# Patient Record
Sex: Male | Born: 1980 | Race: White | Hispanic: No | Marital: Married | Smoking: Former smoker
Health system: Southern US, Community
[De-identification: ages and names within clinical notes are randomized; demographics above are authoritative.]

## PROBLEM LIST (undated history)

## (undated) DIAGNOSIS — IMO0001 Reserved for inherently not codable concepts without codable children: Secondary | ICD-10-CM

## (undated) DIAGNOSIS — J329 Chronic sinusitis, unspecified: Secondary | ICD-10-CM

## (undated) DIAGNOSIS — I10 Essential (primary) hypertension: Secondary | ICD-10-CM

## (undated) DIAGNOSIS — K219 Gastro-esophageal reflux disease without esophagitis: Secondary | ICD-10-CM

## (undated) DIAGNOSIS — N289 Disorder of kidney and ureter, unspecified: Secondary | ICD-10-CM

---

## 2011-05-09 ENCOUNTER — Emergency Department (INDEPENDENT_AMBULATORY_CARE_PROVIDER_SITE_OTHER)
Admission: EM | Admit: 2011-05-09 | Discharge: 2011-05-09 | Disposition: A | Discharge status: Home / Self Care | Payer: Managed Care, Other (non HMO) | Source: Home / Self Care

## 2011-05-09 ENCOUNTER — Encounter (HOSPITAL_COMMUNITY): Payer: Self-pay | Admitting: *Deleted

## 2011-05-09 DIAGNOSIS — J069 Acute upper respiratory infection, unspecified: Secondary | ICD-10-CM

## 2011-05-09 MED ORDER — ACETAMINOPHEN 325 MG PO TABS
ORAL_TABLET | ORAL | Status: AC
  Filled 2011-05-09: qty 3

## 2011-05-09 MED ORDER — GUAIFENESIN-CODEINE 100-10 MG/5ML PO SYRP: 5.0000 mL | ORAL_SOLUTION | Freq: Three times a day (TID) | ORAL | Status: AC | PRN

## 2011-05-09 MED ORDER — IBUPROFEN 800 MG PO TABS
800.0000 mg | ORAL_TABLET | Freq: Once | ORAL | Status: AC
  Administered 2011-05-09: 800 mg via ORAL

## 2011-05-09 MED ORDER — IBUPROFEN 800 MG PO TABS
ORAL_TABLET | ORAL | Status: AC
  Filled 2011-05-09: qty 1

## 2011-05-09 MED ORDER — AZITHROMYCIN 250 MG PO TABS: ORAL_TABLET | ORAL | Status: AC

## 2011-05-09 NOTE — ED Provider Notes (Signed)
Medical screening examination/treatment/procedure(s) were performed by non-physician practitioner and as supervising physician I was immediately available for consultation/collaboration.  Thomas [name]   Robbert Langlinais, MD 05/09/11 1903 

## 2011-05-09 NOTE — Discharge Instructions (Signed)
Thank you for coming in today. I think you have a virus, may be the flu. Use the cough medicine especially at night. Hold on to the antibiotic.  Start it on Wednesday if you don't feel any better. If he takes the antibiotic now will just give you diarrhea. You should get better in a few days. Call or go to the emergency room if you get worse, have trouble breathing, have chest pains, or palpitations.   Antibiotic Nonuse  Your caregiver felt that the infection or problem was not one that would be helped with an antibiotic. Infections may be caused by viruses or bacteria. Only a caregiver can tell which one of these is the likely cause of an illness. A cold is the most common cause of infection in both adults and children. A cold is a virus. Antibiotic treatment will have no effect on a viral infection. Viruses can lead to many lost days of work caring for sick children and many missed days of school. Children may catch as many as 10 "colds" or "flus" per year during which they can be tearful, cranky, and uncomfortable. The goal of treating a virus is aimed at keeping the ill person comfortable. Antibiotics are medications used to help the body fight bacterial infections. There are relatively few types of bacteria that cause infections but there are hundreds of viruses. While both viruses and bacteria cause infection they are very different types of germs. A viral infection will typically go away by itself within 7 to 10 days. Bacterial infections may spread or get worse without antibiotic treatment. Examples of bacterial infections are:  Sore throats (like strep throat or tonsillitis).   Infection in the lung (pneumonia).   Ear and skin infections.  Examples of viral infections are:  Colds or flus.   Most coughs and bronchitis.   Sore throats not caused by Strep.   Runny noses.  It is often best not to take an antibiotic when a viral infection is the cause of the problem. Antibiotics can  kill off the helpful bacteria that we have inside our body and allow harmful bacteria to start growing. Antibiotics can cause side effects such as allergies, nausea, and diarrhea without helping to improve the symptoms of the viral infection. Additionally, repeated uses of antibiotics can cause bacteria inside of our body to become resistant. That resistance can be passed onto harmful bacterial. The next time you have an infection it may be harder to treat if antibiotics are used when they are not needed. Not treating with antibiotics allows our own immune system to develop and take care of infections more efficiently. Also, antibiotics will work better for [place] when they are prescribed for bacterial infections. Treatments for a child that is ill may include:  Give extra fluids throughout the day to stay hydrated.   Get plenty of rest.   Only give your child over-the-counter or prescription medicines for pain, discomfort, or fever as directed by your caregiver.   The use of a cool mist humidifier may help stuffy noses.   Cold medications if suggested by your caregiver.  Your caregiver may decide to start you on an antibiotic if:  The problem you were seen for today continues for a longer length of time than expected.   You develop a secondary bacterial infection.  SEEK MEDICAL CARE IF:  Fever lasts longer than 5 days.   Symptoms continue to get worse after 5 to 7 days or become severe.   Difficulty in  breathing develops.   Signs of dehydration develop (poor drinking, rare urinating, dark colored urine).   Changes in behavior or worsening tiredness (listlessness or lethargy).  Document Released: 04/05/2001 Document Revised: 01/14/2011 Document Reviewed: 10/02/2008 [facility] Patient Information 2012 [place], [place].

## 2011-05-09 NOTE — ED Provider Notes (Signed)
Thomas [name] is a 31 y.o. male who presents to Urgent Care today for cough, fever, chills, congestion since Tuesday. Additionally has a headache and myalgias.  No trouble breathing. Multiple sick contacts at work. Did not receive a flu shot this year. Has tried Tylenol and Sudafed which does help some.  PMH reviewed. Otherwise healthy young man ROS as above otherwise neg.  no chest pains, palpitations,  abdominal pain nausea or vomiting. Medications reviewed. Current Facility-Administered Medications  Medication Dose Route Frequency Provider Last Rate Last Dose  . ibuprofen (ADVIL,MOTRIN) tablet 800 mg  800 mg Oral Once Rodolph Bong, MD   800 mg at 05/09/11 1720   Current Outpatient Prescriptions  Medication Sig Dispense Refill  . Phenyleph-CPM-DM-APAP (TYLENOL COLD HEAD CONGESTION PO) Take by mouth.      . Pseudoeph-Doxylamine-DM-APAP (NYQUIL PO) Take by mouth.      Marland Kitchen azithromycin (ZITHROMAX) 250 MG tablet 2 pills po day 1, 1 pill po days 2-5  6 each  0  . guaiFENesin-codeine (ROBITUSSIN AC) 100-10 MG/5ML syrup Take 5 mLs by mouth 3 (three) times daily as needed for cough.  120 mL  0    Exam:  BP 131/80  Pulse 104  Temp(Src) 102 F (38.9 C) (Oral)  Resp 20  SpO2 100% Gen: Well NAD HEENT: EOMI,  MMM, normal tympanic membranes bilaterally. Normal posterior pharynx. Lungs: CTABL Nl WOB Heart: RRR no MRG Abd: NABS, NT, ND Exts: Non edematous BL  LE, warm and well perfused.    Assessment and Plan: 31 y.o. male with viral URI, likely. Pneumonia less likely as oxygen saturation and lung exam are normal. I additionally do not see any other signs of bacterial infection.  Plan to treat symptomatically with guaifenesin/codeine and Tylenol. However I will prescribe azithromycin for use if not better within 3 or 4 days. Discussed warning signs or problems return to health care. Please see patient instructions.     Rodolph Bong, MD 05/09/11 [phone]

## 2011-05-09 NOTE — ED Notes (Signed)
Pt with onset of cough/congestion/chills/fever/headache x 5 days - taking tyelnol and nyquil without relief

## 2011-10-09 ENCOUNTER — Encounter (HOSPITAL_COMMUNITY): Payer: Self-pay

## 2011-10-09 ENCOUNTER — Emergency Department (HOSPITAL_COMMUNITY)
Admission: EM | Admit: 2011-10-09 | Discharge: 2011-10-09 | Disposition: A | Discharge status: Home / Self Care | Payer: Managed Care, Other (non HMO) | Source: Home / Self Care | Attending: Emergency Medicine | Admitting: Emergency Medicine

## 2011-10-09 DIAGNOSIS — J329 Chronic sinusitis, unspecified: Secondary | ICD-10-CM

## 2011-10-09 DIAGNOSIS — H6690 Otitis media, unspecified, unspecified ear: Secondary | ICD-10-CM

## 2011-10-09 HISTORY — DX: Chronic sinusitis, unspecified: J32.9

## 2011-10-09 MED ORDER — PSEUDOEPHEDRINE-GUAIFENESIN ER 120-1200 MG PO TB12: 1.0000 | ORAL_TABLET | Freq: Two times a day (BID) | ORAL | Status: DC

## 2011-10-09 MED ORDER — CLARITHROMYCIN 500 MG PO TABS: 500.0000 mg | ORAL_TABLET | Freq: Two times a day (BID) | ORAL | Status: AC

## 2011-10-09 MED ORDER — FLUTICASONE PROPIONATE 50 MCG/ACT NA SUSP: 2.0000 | Freq: Every day | NASAL | Status: DC

## 2011-10-09 NOTE — ED Notes (Signed)
Sinus pressure, productive cough with green sputum, left ear feels stopped up . Symptoms started one week ago, progessively getting worse. Denies fever,chills or n/v

## 2011-10-09 NOTE — ED Provider Notes (Signed)
History     CSN: [id number]  Arrival date & time 10/09/11  1103   First MD Initiated Contact with Patient 10/09/11 1104      Chief Complaint  Patient presents with  . Facial Pain    sinus pressure, cough, ear pain    (Consider location/radiation/quality/duration/timing/severity/associated sxs/prior treatment) HPI Comments: Patient reports nasal congestion, frontal sinus pressure/pain, cough productive of greenish sputum in the morning starting a week ago. Reports postnasal drip, sore, irritated throat.. decreased hearing, left ear pain starting several days ago. Patient states this feels identical to previous episodes of sinusitis.  ROS as noted in HPI. All other ROS negative.   Patient is a 31 y.o. male presenting with cough. The history is provided by the patient. No language interpreter was used.  Cough This is a new problem. The current episode started more than 1 week ago. The problem occurs constantly. The problem has not changed since onset.The cough is productive of sputum. There has been no fever. Associated symptoms include ear congestion, ear pain and rhinorrhea. Pertinent negatives include no chills, no sore throat, no myalgias, no shortness of breath and no wheezing. He has tried decongestants for the symptoms. The treatment provided no relief. He is not a smoker.    Past Medical History  Diagnosis Date  . Sinusitis     History reviewed. No pertinent past surgical history.  History reviewed. No pertinent family history.  History  Substance Use Topics  . Smoking status: Former Games developer  . Smokeless tobacco: Not on file  . Alcohol Use: No      Review of Systems  Constitutional: Negative for chills.  HENT: Positive for ear pain and rhinorrhea. Negative for sore throat.   Respiratory: Positive for cough. Negative for shortness of breath and wheezing.   Musculoskeletal: Negative for myalgias.    Allergies  Penicillins  Home Medications   Current  Outpatient Rx  Name Route Sig Dispense Refill  . ADULT MULTIVITAMIN W/MINERALS CH Oral Take 1 tablet by mouth daily.    Marland Kitchen CLARITHROMYCIN 500 MG PO TABS Oral Take 1 tablet (500 mg total) by mouth 2 (two) times daily. X 10 days 20 tablet 0  . FLUTICASONE PROPIONATE 50 MCG/ACT NA SUSP Nasal Place 2 sprays into the nose daily. 16 g 0  . PSEUDOEPHEDRINE-GUAIFENESIN ER [phone] MG PO TB12 Oral Take 1 tablet by mouth 2 (two) times daily. 20 each 0    BP 145/86  Pulse 91  Temp 99.2 F (37.3 C) (Oral)  Resp 18  SpO2 96%  Physical Exam  Nursing note and vitals reviewed. Constitutional: He is oriented to person, place, and time. He appears well-developed and well-nourished.  HENT:  Head: Normocephalic and atraumatic.  Right Ear: Tympanic membrane normal.  Left Ear: There is tenderness. Tympanic membrane is bulging. A middle ear effusion is present.  Nose: Mucosal edema present. Right sinus exhibits maxillary sinus tenderness. Right sinus exhibits no frontal sinus tenderness. Left sinus exhibits maxillary sinus tenderness. Left sinus exhibits no frontal sinus tenderness.  Mouth/Throat: Uvula is midline, oropharynx is clear and moist and mucous membranes are normal.       Purulent nasal d/c  Eyes: Conjunctivae and EOM are normal.  Neck: Normal range of motion.  Cardiovascular: Normal rate, regular rhythm and normal heart sounds.   Pulmonary/Chest: Effort normal. No respiratory distress.  Abdominal: He exhibits no distension.  Musculoskeletal: Normal range of motion.  Lymphadenopathy:    He has no cervical adenopathy.  Neurological: He is alert  and oriented to person, place, and time. Coordination normal.  Skin: Skin is warm and dry.  Psychiatric: He has a normal mood and affect. His behavior is normal. Judgment and thought content normal.    ED Course  Procedures (including critical care time)  Labs Reviewed - No data to display No results found.   1. Sinusitis   2. Otitis media       MDM   Pt with indications for abx as has had sx >10 days. Will start clarithromycin to cover OM and sinus infxn in addition to flonase, mucinex-d, saline nasal irrigation, increase fluids, tylenol/motrin prn pain. Discussed MDM and plan with pt. Pt agrees with plan and will f/u with PMD prn.     Luiz Blare, MD 10/11/11 [phone]

## 2015-02-27 ENCOUNTER — Emergency Department (INDEPENDENT_AMBULATORY_CARE_PROVIDER_SITE_OTHER)
Admission: EM | Admit: 2015-02-27 | Discharge: 2015-02-27 | Disposition: A | Discharge status: Home / Self Care | Payer: Managed Care, Other (non HMO) | Source: Home / Self Care | Attending: Family Medicine | Admitting: Family Medicine

## 2015-02-27 ENCOUNTER — Encounter (HOSPITAL_COMMUNITY): Payer: Self-pay | Admitting: Emergency Medicine

## 2015-02-27 DIAGNOSIS — R05 Cough: Secondary | ICD-10-CM | POA: Diagnosis not present

## 2015-02-27 DIAGNOSIS — R059 Cough, unspecified: Secondary | ICD-10-CM

## 2015-02-27 DIAGNOSIS — J3489 Other specified disorders of nose and nasal sinuses: Secondary | ICD-10-CM

## 2015-02-27 DIAGNOSIS — J069 Acute upper respiratory infection, unspecified: Secondary | ICD-10-CM

## 2015-02-27 HISTORY — DX: Reserved for inherently not codable concepts without codable children: IMO0001

## 2015-02-27 HISTORY — DX: Gastro-esophageal reflux disease without esophagitis: K21.9

## 2015-02-27 NOTE — ED Provider Notes (Signed)
CSN: [id number]     Arrival date & time 02/27/15  1908 History   First MD Initiated Contact with Patient 02/27/15 2100     Chief Complaint  Patient presents with  . Cough   (Consider location/radiation/quality/duration/timing/severity/associated sxs/prior Treatment) HPI Comments: 35 year old male with a cough for 3 weeks. He states it is worse at night. He also has PND and has to clear his throat frequently. Denies fever. He is complaining of his ears feeling stopped up but no pain. no history of asthma and does not currently smoke.    Past Medical History  Diagnosis Date  . Sinusitis   . Reflux    History reviewed. No pertinent past surgical history. History reviewed. No pertinent family history. Social History  Substance Use Topics  . Smoking status: Former Games developer  . Smokeless tobacco: None  . Alcohol Use: No    Review of Systems  Constitutional: Negative for fever, diaphoresis, activity change and fatigue.  HENT: Positive for congestion, postnasal drip, sore throat and trouble swallowing. Negative for ear pain, facial swelling and rhinorrhea.   Eyes: Negative for pain, discharge and redness.  Respiratory: Positive for cough. Negative for chest tightness and shortness of breath.   Cardiovascular: Negative.   Gastrointestinal: Negative.   Musculoskeletal: Negative.  Negative for neck pain and neck stiffness.  Neurological: Negative.     Allergies  Penicillins  Home Medications   Prior to Admission medications   Medication Sig Start Date End Date Taking? Authorizing Provider  Pseudoeph-Doxylamine-DM-APAP (NYQUIL PO) Take by mouth.   Yes Historical Provider, MD  fluticasone (FLONASE) 50 MCG/ACT nasal spray Place 2 sprays into the nose daily. 10/09/11 10/08/12  Domenick Gong, MD  Multiple Vitamin (MULTIVITAMIN WITH MINERALS) TABS Take 1 tablet by mouth daily.    Historical Provider, MD  Pseudoephedrine-Guaifenesin (MUCINEX D) [phone] MG TB12 Take 1 tablet by mouth 2  (two) times daily. 10/09/11   Domenick Gong, MD   Meds Ordered and Administered this Visit  Medications - No data to display  BP 131/89 mmHg  Pulse 90  Temp(Src) 97.5 F (36.4 C) (Oral)  Resp 18  SpO2 96% No data found.   Physical Exam  Constitutional: He is oriented to person, place, and time. He appears well-developed and well-nourished. No distress.  HENT:  Bilateral TMs are retracted with minor injection. No apparent effusion. EACs are clear. Oropharynx with erythema, cobblestoning and moderate amount of clear PND. It is noticed that the patient is presently clearing his throat.  Eyes: EOM are normal.  Neck: Normal range of motion. Neck supple.  Cardiovascular: Normal rate, regular rhythm and normal heart sounds.   Pulmonary/Chest: Effort normal and breath sounds normal. No respiratory distress. He has no wheezes. He has no rales.  Musculoskeletal: Normal range of motion. He exhibits no edema.  Lymphadenopathy:    He has no cervical adenopathy.  Neurological: He is alert and oriented to person, place, and time.  Skin: Skin is warm and dry. No rash noted.  Psychiatric: He has a normal mood and affect.  Nursing note and vitals reviewed.   ED Course  Procedures (including critical care time)  Labs Review Labs Reviewed - No data to display  Imaging Review No results found.   Visual Acuity Review  Right Eye Distance:   Left Eye Distance:   Bilateral Distance:    Right Eye Near:   Left Eye Near:    Bilateral Near:         MDM   1. URI (  upper respiratory infection)   2. Sinus drainage   3. Cough    Cough, Adult Cough is primarily due to copious amounts of drainage in the back of your throat. This drainage coming from your sinuses. The best treatment for this is to take antihistamines. Nondrowsy formulas include Claritin, Allegra or Zyrtec. While at home he may also add Chlor-Trimeton 2-4 mg every 4 hours. This is a little stronger but can cause  drowsiness. For head congestion and stuffiness he may take Sudafed PE 10 mg every 4 hours. Also use saline nasal spray frequently. Drink plenty of fluids and stay well-hydrated. May take Robitussin-DM or Delsym to also help with cough    Hayden Rasmussen, NP 02/27/15 2114

## 2015-02-27 NOTE — ED Notes (Signed)
Started coughing approx 3 weeks ago.  Reports continued coughing, green phlegm.  Denies fever.  No nausea or vomiting, or diarrhea.  Patient has tried otc cold medicines, but no relief.

## 2015-02-27 NOTE — Discharge Instructions (Signed)
Cough, Adult Cough is primarily due to copious amounts of drainage in the back of your throat. This drainage coming from her sinuses. The best treatment for this is to take antihistamines. Nondrowsy formulas include Claritin, Allegra or Zyrtec. While at home he may also add Chlor-Trimeton 2-4 mg every 4 hours. This is a little stronger but can cause drowsiness. For head congestion and stuffiness he may take Sudafed PE 10 mg every 4 hours. Also use saline nasal spray frequently. Drink plenty of fluids and stay well-hydrated. May take Robitussin-DM or Delsym to also help with cough Coughing is a reflex that clears your throat and your airways. Coughing helps to heal and protect your lungs. It is normal to cough occasionally, but a cough that happens with other symptoms or lasts a long time may be a sign of a condition that needs treatment. A cough may last only 2-3 weeks (acute), or it may last longer than 8 weeks (chronic). CAUSES Coughing is commonly caused by:  Breathing in substances that irritate your lungs.  A viral or bacterial respiratory infection.  Allergies.  Asthma.  Postnasal drip.  Smoking.  Acid backing up from the stomach into the esophagus (gastroesophageal reflux).  Certain medicines.  Chronic lung problems, including COPD (or rarely, lung cancer).  Other medical conditions such as heart failure. HOME CARE INSTRUCTIONS  Pay attention to any changes in your symptoms. Take these actions to help with your discomfort:  Take medicines only as told by your health care provider.  If you were prescribed an antibiotic medicine, take it as told by your health care provider. Do not stop taking the antibiotic even if you start to feel better.  Talk with your health care provider before you take a cough suppressant medicine.  Drink enough fluid to keep your urine clear or pale yellow.  If the air is dry, use a cold steam vaporizer or humidifier in your bedroom or your home  to help loosen secretions.  Avoid anything that causes you to cough at work or at home.  If your cough is worse at night, try sleeping in a semi-upright position.  Avoid cigarette smoke. If you smoke, quit smoking. If you need help quitting, ask your health care provider.  Avoid caffeine.  Avoid alcohol.  Rest as needed. SEEK MEDICAL CARE IF:   You have new symptoms.  You cough up pus.  Your cough does not get better after 2-3 weeks, or your cough gets worse.  You cannot control your cough with suppressant medicines and you are losing sleep.  You develop pain that is getting worse or pain that is not controlled with pain medicines.  You have a fever.  You have unexplained weight loss.  You have night sweats. SEEK IMMEDIATE MEDICAL CARE IF:  You cough up blood.  You have difficulty breathing.  Your heartbeat is very fast.   This information is not intended to replace advice given to you by your health care provider. Make sure you discuss any questions you have with your health care provider.   Document Released: 07/24/2010 Document Revised: 10/16/2014 Document Reviewed: 04/03/2014 Elsevier Interactive Patient Education [employer]

## 2015-03-22 ENCOUNTER — Encounter (HOSPITAL_COMMUNITY): Payer: Self-pay | Admitting: Emergency Medicine

## 2015-03-22 ENCOUNTER — Emergency Department (INDEPENDENT_AMBULATORY_CARE_PROVIDER_SITE_OTHER)
Admission: EM | Admit: 2015-03-22 | Discharge: 2015-03-22 | Disposition: A | Discharge status: Home / Self Care | Payer: Managed Care, Other (non HMO) | Source: Home / Self Care | Attending: Family Medicine | Admitting: Family Medicine

## 2015-03-22 DIAGNOSIS — J0100 Acute maxillary sinusitis, unspecified: Secondary | ICD-10-CM

## 2015-03-22 MED ORDER — DOXYCYCLINE HYCLATE 100 MG PO CAPS: 100.0000 mg | ORAL_CAPSULE | Freq: Two times a day (BID) | ORAL | Status: DC

## 2015-03-22 MED ORDER — IPRATROPIUM BROMIDE 0.06 % NA SOLN: 2.0000 | Freq: Four times a day (QID) | NASAL | Status: DC

## 2015-03-22 NOTE — ED Provider Notes (Signed)
CSN: [id number]     Arrival date & time 03/22/15  1413 History   First MD Initiated Contact with Patient 03/22/15 1507     Chief Complaint  Patient presents with  . Sinus Problem   (Consider location/radiation/quality/duration/timing/severity/associated sxs/prior Treatment) Patient is a 35 y.o. male presenting with sinus complaint. The history is provided by the patient.  Sinus Problem This is a recurrent problem. The current episode started more than 2 days ago. The problem has been gradually worsening. Associated symptoms include headaches.    Past Medical History  Diagnosis Date  . Sinusitis   . Reflux    History reviewed. No pertinent past surgical history. No family history on file. Social History  Substance Use Topics  . Smoking status: Former Games developer  . Smokeless tobacco: None  . Alcohol Use: No    Review of Systems  Constitutional: Negative.   HENT: Positive for congestion, postnasal drip and sinus pressure.   Respiratory: Negative.   Cardiovascular: Negative.   Neurological: Positive for headaches.  All other systems reviewed and are negative.   Allergies  Penicillins  Home Medications   Prior to Admission medications   Medication Sig Start Date End Date Taking? Authorizing Provider  fluticasone (FLONASE) 50 MCG/ACT nasal spray Place 2 sprays into the nose daily. 10/09/11 10/08/12  Domenick Gong, MD  Multiple Vitamin (MULTIVITAMIN WITH MINERALS) TABS Take 1 tablet by mouth daily.    Historical Provider, MD  Pseudoeph-Doxylamine-DM-APAP (NYQUIL PO) Take by mouth.    Historical Provider, MD  Pseudoephedrine-Guaifenesin (MUCINEX D) [phone] MG TB12 Take 1 tablet by mouth 2 (two) times daily. 10/09/11   Domenick Gong, MD   Meds Ordered and Administered this Visit  Medications - No data to display  BP 128/91 mmHg  Pulse 92  Temp(Src) 98 F (36.7 C) (Oral)  SpO2 96% No data found.   Physical Exam  Constitutional: He is oriented to person, place, and  time. He appears well-developed and well-nourished. No distress.  HENT:  Head: Normocephalic.  Right Ear: External ear normal.  Left Ear: Tympanic membrane is erythematous. Tympanic membrane mobility is abnormal.  Mouth/Throat: Oropharynx is clear and moist.  Neck: Normal range of motion. Neck supple.  Cardiovascular: Normal heart sounds and intact distal pulses.   Pulmonary/Chest: Effort normal and breath sounds normal.  Lymphadenopathy:    He has no cervical adenopathy.  Neurological: He is alert and oriented to person, place, and time.  Skin: Skin is warm and dry.  Nursing note and vitals reviewed.   ED Course  Procedures (including critical care time)  Labs Review Labs Reviewed - No data to display  Imaging Review No results found.   Visual Acuity Review  Right Eye Distance:   Left Eye Distance:   Bilateral Distance:    Right Eye Near:   Left Eye Near:    Bilateral Near:         MDM  No diagnosis found.  Meds ordered this encounter  Medications  . doxycycline (VIBRAMYCIN) 100 MG capsule    Sig: Take 1 capsule (100 mg total) by mouth 2 (two) times daily.    Dispense:  20 capsule    Refill:  0  . ipratropium (ATROVENT) 0.06 % nasal spray    Sig: Place 2 sprays into both nostrils 4 (four) times daily.    Dispense:  15 mL    Refill:  1      Linna Hoff, MD 03/22/15 1539

## 2015-03-22 NOTE — ED Notes (Signed)
Here with sinus pressure between eyes with nasal congestion that started 3 days ago Sudafed and other otc meds not working Denies blurred vision, dizziness,n,v

## 2015-03-22 NOTE — Discharge Instructions (Signed)
Drink plenty of fluids as discussed, use medicine as prescribed, and mucinex or delsym for cough. Return or see your doctor if further problems °

## 2015-06-02 ENCOUNTER — Ambulatory Visit: Payer: Managed Care, Other (non HMO) | Admitting: Physician Assistant

## 2015-06-18 ENCOUNTER — Ambulatory Visit: Payer: Managed Care, Other (non HMO) | Admitting: Physician Assistant

## 2015-06-21 ENCOUNTER — Encounter: Payer: Self-pay | Admitting: Physician Assistant

## 2016-04-14 ENCOUNTER — Emergency Department (HOSPITAL_COMMUNITY)
Admission: EM | Admit: 2016-04-14 | Discharge: 2016-04-14 | Disposition: A | Discharge status: Home / Self Care | Payer: Managed Care, Other (non HMO) | Attending: Emergency Medicine | Admitting: Emergency Medicine

## 2016-04-14 ENCOUNTER — Encounter (HOSPITAL_COMMUNITY): Payer: Self-pay | Admitting: Emergency Medicine

## 2016-04-14 ENCOUNTER — Emergency Department (HOSPITAL_COMMUNITY): Payer: Managed Care, Other (non HMO)

## 2016-04-14 DIAGNOSIS — Z87891 Personal history of nicotine dependence: Secondary | ICD-10-CM | POA: Insufficient documentation

## 2016-04-14 DIAGNOSIS — H538 Other visual disturbances: Secondary | ICD-10-CM | POA: Diagnosis not present

## 2016-04-14 LAB — CBG MONITORING, ED: Glucose-Capillary: 93 mg/dL (ref 65–99)

## 2016-04-14 NOTE — ED Triage Notes (Signed)
Pt states over 1 week ago he started having blurry peripheral vision that comes and goes. Pt states 45 minutes ago he had a 10 minute episode of blurry vision that has now resolved. Pt has no other neuro deificts at this time.

## 2016-04-14 NOTE — ED Notes (Signed)
Patient transported to CT 

## 2016-04-14 NOTE — ED Notes (Signed)
EDP at bedside  

## 2016-04-14 NOTE — ED Provider Notes (Signed)
[facility]ER[facility]ENCY DEPT Provider Note   CSN: [id number] Arrival date & time: 04/14/16  1032     History   Chief Complaint Chief Complaint  Patient presents with  . Blurred Vision    HPI Thomas [name] is a 36 y.o. male.  36 yo M with blurred vision.  Going on for past couple of weeks. Started in the periphery now occurring more often and is his whole vision.  Gets anxious with episodes.  Seems to be better with one eye closure.  Feels like the world is spinning.  Denies headache. Nothing seems to trigger these events.    The history is provided by the patient, the spouse and a parent.  Illness  This is a new problem. The current episode started more than 2 days ago. The problem occurs constantly. The problem has not changed since onset.Pertinent negatives include no chest pain, no abdominal pain, no headaches and no shortness of breath. Nothing aggravates the symptoms. Nothing relieves the symptoms. He has tried nothing for the symptoms. The treatment provided no relief.    Past Medical History:  Diagnosis Date  . Reflux   . Sinusitis     There are no active problems to display for this patient.   History reviewed. No pertinent surgical history.     Home Medications    Prior to Admission medications   Medication Sig Start Date End Date Taking? Authorizing Provider  fluticasone (FLONASE) 50 MCG/ACT nasal spray Place 2 sprays into the nose daily. Patient not taking: Reported on 04/14/2016 10/09/11 10/08/12  Domenick Gong, MD  ipratropium (ATROVENT) 0.06 % nasal spray Place 2 sprays into both nostrils 4 (four) times daily. Patient not taking: Reported on 04/14/2016 03/22/15   Linna Hoff, MD  Pseudoephedrine-Guaifenesin [facility] D) [phone] MG TB12 Take 1 tablet by mouth 2 (two) times daily. Patient not taking: Reported on 04/14/2016 10/09/11   Domenick Gong, MD    Family History No family history on file.  Social History Social History  Substance Use Topics  .  Smoking status: Former Games developer  . Smokeless tobacco: Not on file  . Alcohol use No     Allergies   Penicillins   Review of Systems Review of Systems  Constitutional: Negative for chills and fever.  HENT: Negative for congestion and facial swelling.   Eyes: Positive for visual disturbance (blurred vision). Negative for discharge.  Respiratory: Negative for shortness of breath.   Cardiovascular: Negative for chest pain and palpitations.  Gastrointestinal: Negative for abdominal pain, diarrhea, nausea and vomiting.  Musculoskeletal: Negative for arthralgias and myalgias.  Skin: Negative for color change and rash.  Neurological: Positive for dizziness. Negative for tremors, syncope and headaches.  Psychiatric/Behavioral: Negative for confusion and dysphoric mood.     Physical Exam Updated Vital Signs BP (!) 149/101   Pulse 93   Temp 98.6 F (37 C) (Oral)   Resp 14   Ht 5\' 9"  (1.753 m)   Wt 204 lb (92.5 kg)   SpO2 95%   BMI 30.13 kg/m   Physical Exam  Constitutional: He is oriented to person, place, and time. He appears well-developed and well-nourished.  HENT:  Head: Normocephalic and atraumatic.  Eyes: EOM are normal. Pupils are equal, round, and reactive to light.  Neck: Normal range of motion. Neck supple. No JVD present.  Cardiovascular: Normal rate and regular rhythm.  Exam reveals no gallop and no friction rub.   No murmur heard. Pulmonary/Chest: No respiratory distress. He has no wheezes.  Abdominal:  He exhibits no distension and no mass. There is no tenderness. There is no rebound and no guarding.  Musculoskeletal: Normal range of motion.  Neurological: He is alert and oriented to person, place, and time. He has normal strength. No cranial nerve deficit or sensory deficit. Coordination and gait normal. GCS eye subscore is 4. GCS verbal subscore is 5. GCS motor subscore is 6. He displays no Babinski's sign on the right side. He displays no Babinski's sign on the  left side.  Reflex Scores:      Tricep reflexes are 2+ on the right side and 2+ on the left side.      Bicep reflexes are 2+ on the right side and 2+ on the left side.      Brachioradialis reflexes are 2+ on the right side and 2+ on the left side.      Patellar reflexes are 2+ on the right side and 2+ on the left side.      Achilles reflexes are 2+ on the right side and 2+ on the left side. Skin: No rash noted. No pallor.  Psychiatric: He has a normal mood and affect. His behavior is normal.  Nursing note and vitals reviewed.    ED Treatments / Results  Labs (all labs ordered are listed, but only abnormal results are displayed) Labs Reviewed  CBG MONITORING, ED    EKG  EKG Interpretation None       Radiology Ct Head Wo Contrast  Result Date: 04/14/2016 CLINICAL DATA:  Blurred vision, dizziness EXAM: CT HEAD WITHOUT CONTRAST TECHNIQUE: Contiguous axial images were obtained from the base of the skull through the vertex without intravenous contrast. COMPARISON:  None. FINDINGS: Brain: No evidence of acute infarction, hemorrhage, hydrocephalus, extra-axial collection or mass lesion/mass effect. Vascular: No hyperdense vessel or unexpected calcification. Skull: Normal. Negative for fracture or focal lesion. Sinuses/Orbits: The visualized paranasal sinuses are essentially clear. The mastoid air cells are unopacified. Other: None. IMPRESSION: Normal head CT. Electronically Signed   By: Charline BillsSriyesh  Krishnan M.D.   On: 04/14/2016 12:28   Mr Brain Wo Contrast  Result Date: 04/14/2016 CLINICAL DATA:  New onset episodes of blurred vision beginning 1 week ago. EXAM: MRI HEAD WITHOUT CONTRAST TECHNIQUE: Multiplanar, multiecho pulse sequences of the brain and surrounding structures were obtained without intravenous contrast. COMPARISON:  CT head without contrast from the same day. FINDINGS: Brain: No acute infarct, hemorrhage, or mass lesion is present. The ventricles are of normal size. No significant  extraaxial fluid collection is present. No significant white matter disease is present. Internal auditory canals are within normal limits. Vascular: Flow is present in the major intracranial arteries. Skull and upper cervical spine: The skullbase is normal. The craniocervical junction is normal. Midline sagittal structures are unremarkable. Sinuses/Orbits: Mild diffuse mucosal thickening present throughout the paranasal sinuses. There are no significant fluid levels. The mastoid air cells are clear. The globes and orbits are within normal limits bilaterally. IMPRESSION: Negative MRI of the brain. No acute or focal lesion to explain the patient's visual changes. Electronically Signed   By: Marin Robertshristopher  Mattern M.D.   On: 04/14/2016 13:42    Procedures Procedures (including critical care time)  Medications Ordered in ED Medications - No data to display   Initial Impression / Assessment and Plan / ED Course  I have reviewed the triage vital signs and the nursing notes.  Pertinent labs & imaging results that were available during my care of the patient were reviewed by me and considered  in my medical decision making (see chart for details).     36 yo M with blurry vision.  Going on for past couple of months. Discussed with neuro, due to length of symptoms recommended MR at this time.  MRI is negative for acute process. CT of the head was also negative. Patient was given follow-up with ophthalmology as well as neurology.  2:42 PM:  I have discussed the diagnosis/risks/treatment options with the patient and family and believe the pt to be eligible for discharge home to follow-up with PCP. We also discussed returning to the ED immediately if new or worsening sx occur. We discussed the sx which are most concerning (e.g., sudden worsening pain, fever, inability to tolerate by mouth) that necessitate immediate return. Medications administered to the patient during their visit and any new prescriptions  provided to the patient are listed below.  Medications given during this visit Medications - No data to display   The patient appears reasonably screen and/or stabilized for discharge and I doubt any other medical condition or other [facility] requiring further screening, evaluation, or treatment in the ED at this time prior to discharge.    Final Clinical Impressions(s) / ED Diagnoses   Final diagnoses:  Blurry vision    New Prescriptions New Prescriptions   No medications on file     Melene Plan, DO 04/14/16 1442

## 2016-04-14 NOTE — ED Notes (Signed)
PA at bedside.

## 2016-04-14 NOTE — Discharge Instructions (Signed)
Follow up with neurology and optho

## 2016-04-16 ENCOUNTER — Encounter: Payer: Self-pay | Admitting: Neurology

## 2016-05-06 ENCOUNTER — Encounter: Payer: Self-pay | Admitting: Neurology

## 2016-05-06 ENCOUNTER — Ambulatory Visit (INDEPENDENT_AMBULATORY_CARE_PROVIDER_SITE_OTHER): Payer: Managed Care, Other (non HMO) | Admitting: Neurology

## 2016-05-06 VITALS — BP 118/84 | HR 104 | Ht 69.0 in | Wt 206.1 lb

## 2016-05-06 DIAGNOSIS — G43109 Migraine with aura, not intractable, without status migrainosus: Secondary | ICD-10-CM | POA: Diagnosis not present

## 2016-05-06 NOTE — Progress Notes (Signed)
[facility] Neurology Division Clinic Note - Initial Visit   Date: 05/06/16  Khyree Carillo MRN: [id number] DOB: February 26, 1980   Dear Dr. Jeanice Lim:  Thank you for your kind referral of Fairley Copher for consultation of vision changes. Although his history is well known to you, please allow [place] to reiterate it for the purpose of our medical record. The patient was accompanied to the clinic by wife who also provides collateral information.     History of Present Illness: Morrie Daywalt is a 36 y.o. right-handed Caucasian male with GERD and tobacco user presenting for evaluation of vision changes.    On March 7th, he was driving in his mother's driveway and suddenly developed tunnel/blurry vision.  He did not loose vision, but states that the blurred vision was severe that he could not focus.  He pulled over and tried to close each eye and noticed that it continued be present in both eyes.  Symptoms lasted about 10 minutes and self-resolved. He went to the ER where CT head and MRI brain was negative.   Two weeks prior to this, he developed blurry spot around his peripheral vision which was transient.    It happened again last week at work when he was walking to the break lounge and developed the same severe blurry vision.  It occurs for 10-15 minutes and self resolves.  He has associated lightheadedness and feeling anxious when this happens.  He denies palpitations.   He does not have associated headaches, numbness/tingling, or eye pain.   He saw his eye doctor who noted mild refractive error, but did not recommend corrective lenses.  Otherwise, he had good eye health.   No family or personal history of migraines.   Out-side paper records, electronic medical record, and images have been reviewed where available and summarized as:  MRI brain wo contrast 04/14/2016:  Negative MRI of the brain. No acute or focal lesion to explain the patient's visual changes.  CT head 04/14/2016:   Normal  Past Medical History:  Diagnosis Date  . Reflux   . Sinusitis     No past surgical history on file.   Medications:  Outpatient Encounter Prescriptions as of 05/06/2016  Medication Sig  . [DISCONTINUED] fluticasone (FLONASE) 50 MCG/ACT nasal spray Place 2 sprays into the nose daily. (Patient not taking: Reported on 04/14/2016)  . [DISCONTINUED] ipratropium (ATROVENT) 0.06 % nasal spray Place 2 sprays into both nostrils 4 (four) times daily. (Patient not taking: Reported on 04/14/2016)  . [DISCONTINUED] Pseudoephedrine-Guaifenesin (MUCINEX D) [phone] MG TB12 Take 1 tablet by mouth 2 (two) times daily. (Patient not taking: Reported on 04/14/2016)   No facility-administered encounter medications on file as of 05/06/2016.      Allergies:  Allergies  Allergen Reactions  . Penicillins Rash    Pt reports not allergic    Family History: Family History  Problem Relation Age of Onset  . Breast cancer Mother   . Pneumonia Father   . COPD Sister   . Diabetes Mellitus I Maternal Grandmother   . Diabetes Mellitus I Paternal Grandmother   . Heart disease Paternal Grandfather     Social History: Social History  Substance Use Topics  . Smoking status: Current Every Day Smoker    Packs/day: 1.00    Years: 15.00  . Smokeless tobacco: Never Used  . Alcohol use Yes     Comment: occasionally   Social History   Social History Narrative   Lives with wife in a one story home.  Has 2 children.     Works for [employer].     Education: high school.     Review of Systems:  CONSTITUTIONAL: No fevers, chills, night sweats, or weight loss.   EYES: No visual changes or eye pain ENT: No hearing changes.  No history of nose bleeds.   RESPIRATORY: No cough, wheezing and shortness of breath.   CARDIOVASCULAR: Negative for chest pain, and palpitations.   GI: Negative for abdominal discomfort, blood in stools or black stools.  No recent change in bowel habits.   GU:  No history of  incontinence.   MUSCLOSKELETAL: No history of joint pain or swelling.  No myalgias.   SKIN: Negative for lesions, rash, and itching.   HEMATOLOGY/ONCOLOGY: Negative for prolonged bleeding, bruising easily, and swollen nodes.  No history of cancer.   ENDOCRINE: Negative for cold or heat intolerance, polydipsia or goiter.   PSYCH:  No depression + anxiety symptoms.   NEURO: As Above.   Vital Signs:  BP 118/84   Pulse (!) 104   Ht 5\' 9"  (1.753 m)   Wt 206 lb 1 oz (93.5 kg)   SpO2 95%   BMI 30.43 kg/m    General Medical Exam:   General:  Well appearing, comfortable.   Eyes/ENT: see cranial nerve examination.   Neck: No masses appreciated.  Full range of motion without tenderness.  No carotid bruits. Respiratory:  Clear to auscultation, good air entry bilaterally.   Cardiac:  Regular rate and rhythm, no murmur.   Extremities:  No deformities, edema, or skin discoloration.  Skin:  No rashes or lesions.  Neurological Exam: MENTAL STATUS including orientation to time, place, person, recent and remote memory, attention span and concentration, language, and fund of knowledge is normal.  Speech is not dysarthric.  CRANIAL NERVES: II:  No visual field defects.  Unremarkable fundi.   III-IV-VI: Pupils equal round and reactive to light.  Normal conjugate, extra-ocular eye movements in all directions of gaze.  No nystagmus.  No ptosis.   V:  Normal facial sensation.    VII:  Normal facial symmetry and movements.  No pathologic facial reflexes.  VIII:  Normal hearing and vestibular function.   IX-X:  Normal palatal movement.   XI:  Normal shoulder shrug and head rotation.   XII:  Normal tongue strength and range of motion, no deviation or fasciculation.  MOTOR:  No atrophy, fasciculations or abnormal movements.  No pronator drift.  Tone is normal.    Right Upper Extremity:    Left Upper Extremity:    Deltoid  5/5   Deltoid  5/5   Biceps  5/5   Biceps  5/5   Triceps  5/5   Triceps  5/5    Wrist extensors  5/5   Wrist extensors  5/5   Wrist flexors  5/5   Wrist flexors  5/5   Finger extensors  5/5   Finger extensors  5/5   Finger flexors  5/5   Finger flexors  5/5   Dorsal interossei  5/5   Dorsal interossei  5/5   Abductor pollicis  5/5   Abductor pollicis  5/5   Tone (Ashworth scale)  0  Tone (Ashworth scale)  0   Right Lower Extremity:    Left Lower Extremity:    Hip flexors  5/5   Hip flexors  5/5   Hip extensors  5/5   Hip extensors  5/5   Knee flexors  5/5   Knee  flexors  5/5   Knee extensors  5/5   Knee extensors  5/5   Dorsiflexors  5/5   Dorsiflexors  5/5   Plantarflexors  5/5   Plantarflexors  5/5   Toe extensors  5/5   Toe extensors  5/5   Toe flexors  5/5   Toe flexors  5/5   Tone (Ashworth scale)  0  Tone (Ashworth scale)  0   MSRs:  Right                                                                 Left brachioradialis 2+  brachioradialis 2+  biceps 2+  biceps 2+  triceps 2+  triceps 2+  patellar 2+  patellar 2+  ankle jerk 2+  ankle jerk 2+  Hoffman no  Hoffman no  plantar response down  plantar response down   SENSORY:  Normal and symmetric perception of light touch, pinprick, vibration, and proprioception.  Romberg's sign absent.   COORDINATION/GAIT: Normal finger-to- nose-finger and heel-to-shin.  Intact rapid alternating movements bilaterally.  Able to rise from a chair without using arms.  Gait narrow based and stable. Tandem and stressed gait intact.    IMPRESSION: Mr. Donnajean Lopesittle is a 36 year old gentleman referred for evaluation of transient vision changes, described as very blurry as if looking through a kaleidoscope. His exam is entirely normal and nonfocal. He has had CT and MRI brain which does not show any worrisome findings. His eye evaluation also has been unremarkable. 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 I recommend that he continue to monitor symptoms and pain attention to  any preceding symptoms or warnings. Encouraged him to be cautious when driving stopped driving should he developed these symptoms, which he is aware of. If symptoms become more frequent, we may need to start him on a preventative medication.  Return to clinic if symptoms worsen.   The duration of this appointment visit was 40 minutes of face-to-face time with the patient.  Greater than 50% of this time was spent in counseling, explanation of diagnosis, planning of further management, and coordination of care.   Thank you for allowing me to participate in patient's care.  If I can answer any additional questions, I would be pleased to do so.    Sincerely,    Donika K. Allena KatzPatel, DO

## 2016-05-06 NOTE — Patient Instructions (Addendum)
Keep a journal of your events   Return to clinic as needed

## 2016-07-02 ENCOUNTER — Encounter: Payer: Self-pay | Admitting: Family Medicine

## 2016-11-15 ENCOUNTER — Ambulatory Visit (HOSPITAL_COMMUNITY)
Admission: EM | Admit: 2016-11-15 | Discharge: 2016-11-15 | Disposition: A | Discharge status: Home / Self Care | Payer: Self-pay | Attending: Urgent Care | Admitting: Urgent Care

## 2016-11-15 ENCOUNTER — Encounter (HOSPITAL_COMMUNITY): Payer: Self-pay | Admitting: Emergency Medicine

## 2016-11-15 DIAGNOSIS — F17200 Nicotine dependence, unspecified, uncomplicated: Secondary | ICD-10-CM

## 2016-11-15 DIAGNOSIS — B9789 Other viral agents as the cause of diseases classified elsewhere: Secondary | ICD-10-CM

## 2016-11-15 DIAGNOSIS — J029 Acute pharyngitis, unspecified: Secondary | ICD-10-CM

## 2016-11-15 DIAGNOSIS — J069 Acute upper respiratory infection, unspecified: Secondary | ICD-10-CM

## 2016-11-15 MED ORDER — CETIRIZINE HCL 10 MG PO TABS: 10.0000 mg | ORAL_TABLET | Freq: Every day | ORAL | 0 refills | Status: DC

## 2016-11-15 MED ORDER — HYDROCODONE-HOMATROPINE 5-1.5 MG/5ML PO SYRP: 5.0000 mL | ORAL_SOLUTION | Freq: Every evening | ORAL | 0 refills | Status: DC | PRN

## 2016-11-15 MED ORDER — PSEUDOEPHEDRINE HCL ER 120 MG PO TB12: 120.0000 mg | ORAL_TABLET | Freq: Two times a day (BID) | ORAL | 3 refills | Status: DC

## 2016-11-15 MED ORDER — BENZONATATE 100 MG PO CAPS: 100.0000 mg | ORAL_CAPSULE | Freq: Three times a day (TID) | ORAL | 0 refills | Status: DC | PRN

## 2016-11-15 NOTE — ED Triage Notes (Addendum)
Cough, sneezing, body aches.  Onset of symptoms 2-3 days ago.  Has tried theraflu and mucinex.  Does not feel these medicines have helped Patient feels pressure in both ears.  Patient complains of head congestion

## 2016-11-15 NOTE — ED Provider Notes (Signed)
MRN: [id number] DOB: [birth year]  Subjective:   Thomas [name] is a 36 y.o. male presenting for chief complaint of URI  Reports 3 day history of productive cough, sinus congestion, bilateral ear fullness and pressure, body aches, malaise, sore throat (now improved). Has tried thera-flu and Mucinex with minimal relief. Smokes 1ppd. Denies fever, sinus pain, ear pain, chest pain, shob, wheezing, n/v, abdominal pain, rashes.   Thomas [name] is not currently taking any medications and is allergic to penicillins.  Thomas [name]  has a past medical history of Reflux and Sinusitis. Denies past surgical history.  Objective:   Vitals: BP 129/89 (BP Location: Left Arm)   Pulse 95   Temp 98.2 F (36.8 C) (Oral)   Resp 20   SpO2 97%   Physical Exam  Constitutional: He is oriented to person, place, and time. He appears well-developed and well-nourished.  HENT:  Mouth/Throat: Oropharynx is clear and moist.  Eyes: Right eye exhibits no discharge. Left eye exhibits no discharge.  Neck: Normal range of motion. Neck supple.  Cardiovascular: Normal rate, regular rhythm and intact distal pulses.  Exam reveals no gallop and no friction rub.   No murmur heard. Pulmonary/Chest: No respiratory distress. He has no wheezes. He has no rales.  Lymphadenopathy:    He has no cervical adenopathy.  Neurological: He is alert and oriented to person, place, and time.  Skin: Skin is warm and dry.   Assessment and Plan :   Viral URI with cough  Sore throat  Tobacco use disorder  Will manage supportively with Zyrtec, Sudafed. Use cough suppression medications. Return-to-clinic precautions discussed, patient verbalized understanding.    Wallis Bamberg, PA-C [facility]  11/15/2016  11:03 AM    Wallis Bamberg, PA-C 11/15/16 1139

## 2017-03-23 ENCOUNTER — Encounter (HOSPITAL_COMMUNITY): Payer: Self-pay

## 2017-03-23 ENCOUNTER — Emergency Department (HOSPITAL_COMMUNITY): Payer: Self-pay

## 2017-03-23 ENCOUNTER — Emergency Department (HOSPITAL_COMMUNITY)
Admission: EM | Admit: 2017-03-23 | Discharge: 2017-03-23 | Disposition: A | Discharge status: Home / Self Care | Payer: Self-pay | Attending: Emergency Medicine | Admitting: Emergency Medicine

## 2017-03-23 DIAGNOSIS — R05 Cough: Secondary | ICD-10-CM | POA: Insufficient documentation

## 2017-03-23 DIAGNOSIS — R059 Cough, unspecified: Secondary | ICD-10-CM

## 2017-03-23 DIAGNOSIS — F17210 Nicotine dependence, cigarettes, uncomplicated: Secondary | ICD-10-CM | POA: Insufficient documentation

## 2017-03-23 DIAGNOSIS — Z79899 Other long term (current) drug therapy: Secondary | ICD-10-CM | POA: Insufficient documentation

## 2017-03-23 MED ORDER — BENZONATATE 100 MG PO CAPS: 100.0000 mg | ORAL_CAPSULE | Freq: Three times a day (TID) | ORAL | 0 refills | Status: DC

## 2017-03-23 NOTE — Discharge Instructions (Signed)
Take Tessalon as needed for cough.  Drink plenty of fluids and get plenty of rest.  Use warm water salt gargles, honey, tea, and throat lozenges to soothe hoarse voice/sore throat.  You may also try over-the-counter acid reflux medicines which may help with your cough and hoarse voice.  Follow-up with a primary care physician for reevaluation of your symptoms.  Return to the emergency department if any concerning signs or symptoms develop.

## 2017-03-23 NOTE — ED Triage Notes (Signed)
Patient reports of cough x1 month. Denies fever. Has taken otc medications with no relief.

## 2017-03-23 NOTE — ED Provider Notes (Signed)
[facility] Provider Note   CSN: [id number] Arrival date & time: 03/23/17  1047     History   Chief Complaint Chief Complaint  Patient presents with  . Cough    HPI Doroteo BradfordBenjamin Beyene is a 37 y.o. male with history of reflux and sinusitis presents today for evaluation of acute onset cough for 1 month.  Cough is nonproductive.  He denies fevers or chills.  No chest pain or shortness of breath.  He denies nasal congestion or sore throat.  He states that he recently developed a hoarse voice.  No recent travel or surgeries, no prior history of DVT or PE.  No hemoptysis.  States his daughter recently had bronchitis but his symptoms have been ongoing for longer than that.  He has tried over-the-counter NyQuil and Delsym without significant relief of his symptoms.  He is a smoker of approximately a pack of cigarettes daily.  The history is provided by the patient.    Past Medical History:  Diagnosis Date  . Reflux   . Sinusitis     Patient Active Problem List   Diagnosis Date Noted  . 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 No pertinent surgical history.     Home Medications    Prior to Admission medications   Medication Sig Start Date End Date Taking? Authorizing Provider  benzonatate (TESSALON) 100 MG capsule Take 1 capsule (100 mg total) by mouth every 8 (eight) hours. 03/23/17   Dimitra Woodstock A, PA-C  cetirizine (ZYRTEC ALLERGY) 10 MG tablet Take 1 tablet (10 mg total) by mouth daily. 11/15/16   Wallis BambergMani, Mario, PA-C  Chlorphen-Pseudoephed-APAP [facility](THERAFLU FLU/COLD PO) Take by mouth.    [provider]  guaiFENesin (MUCINEX) 600 MG 12 hr tablet Take by mouth 2 (two) times daily.    [provider]  HYDROcodone-homatropine (HYCODAN) 5-1.5 MG/5ML syrup Take 5 mLs by mouth at bedtime as needed. 11/15/16   Wallis BambergMani, Mario, PA-C  pseudoephedrine (SUDAFED 12 HOUR) 120 MG 12 hr tablet Take 1 tablet (120 mg total) by mouth 2 (two) times daily. 11/15/16    Wallis BambergMani, Mario, PA-C    Family History Family History  Problem Relation Age of Onset  . Breast cancer Mother   . Pneumonia Father   . COPD Sister   . Diabetes Mellitus I Maternal Grandmother   . Diabetes Mellitus I Paternal Grandmother   . Heart disease Paternal Grandfather     Social History Social History   Tobacco Use  . Smoking status: Current Every Day Smoker    Packs/day: 1.00    Years: 15.00    Pack years: 15.00  . Smokeless tobacco: Never Used  Substance Use Topics  . Alcohol use: Yes    Comment: occasionally  . Drug use: No     Allergies   Penicillins   Review of Systems Review of Systems  Constitutional: Negative for chills and fever.  HENT: Positive for voice change. Negative for congestion and sore throat.   Respiratory: Positive for cough. Negative for shortness of breath.   Cardiovascular: Negative for chest pain.  All other systems reviewed and are negative.    Physical Exam Updated Vital Signs BP 128/87   Pulse 90   Temp 98.2 F (36.8 C) (Oral)   Resp 17   Ht 5\' 9"  (1.753 m)   Wt 91.6 kg (202 lb)   SpO2 95%   BMI 29.83 kg/m   Physical Exam  Constitutional: He appears well-developed and well-nourished. No distress.  HENT:  Head: Normocephalic and atraumatic.  Right Ear: External ear normal.  Left Ear: External ear normal.  Nose: Nose normal.  Mouth/Throat: Oropharynx is clear and moist.  TMs without erythema or bulging bilaterally.  Nasal septum midline without mucosal edema.  Posterior oropharynx with postnasal drip and mild erythema but no tonsillar hypertrophy, exudates, or uvular deviation.  No trismus or sublingual abnormalities.  Speaks with a mildly hoarse voice.  Eyes: Conjunctivae and EOM are normal. Pupils are equal, round, and reactive to light. Right eye exhibits no discharge. Left eye exhibits no discharge.  Neck: Normal range of motion. Neck supple. No JVD present. No tracheal deviation present.  Cardiovascular: Normal  rate, regular rhythm, normal heart sounds and intact distal pulses.  Pulmonary/Chest: Effort normal and breath sounds normal. No stridor. No respiratory distress. He has no wheezes. He has no rales. He exhibits no tenderness.  Abdominal: Soft. Bowel sounds are normal. He exhibits no distension. There is no tenderness.  Musculoskeletal: Normal range of motion. He exhibits no edema.  Lymphadenopathy:    He has no cervical adenopathy.  Neurological: He is alert.  Skin: Skin is warm and dry. No erythema.  Psychiatric: He has a normal mood and affect. His behavior is normal.  Nursing note and vitals reviewed.    ED Treatments / Results  Labs (all labs ordered are listed, but only abnormal results are displayed) Labs Reviewed - No data to display  EKG  EKG Interpretation None       Radiology Dg Chest 2 View  Result Date: 03/23/2017 CLINICAL DATA:  Dry cough for the past month. EXAM: CHEST  2 VIEW COMPARISON:  None. FINDINGS: The heart size and mediastinal contours are within normal limits. Both lungs are clear. The visualized skeletal structures are unremarkable. IMPRESSION: No active cardiopulmonary disease. Electronically Signed   By: Obie Dredge M.D.   On: 03/23/2017 11:49    Procedures Procedures (including critical care time)  Medications Ordered in ED Medications - No data to display   Initial Impression / Assessment and Plan / ED Course  I have reviewed the triage vital signs and the nursing notes.  Pertinent labs & imaging results that were available during my care of the patient were reviewed by me and considered in my medical decision making (see chart for details).     Patient with cough for 1 month, not worsening.  Afebrile, vital signs are stable (initially tachycardic but this resolved on reevaluation).  He is nontoxic in appearance.  No fever or meningeal signs to suggest meningitis.  Chest x-ray shows no acute abnormality such as pneumonia or pleural  effusion or bronchitis.  No medication changes to explain cough.  I doubt PE in the absence of shortness of breath or hypoxia.  Possible GERD involvement.  Will discharge with Tessalon and discussed symptomatic treatment.  Recommend follow-up with primary care physician for reevaluation of symptoms.  Discussed indications for return to the ED. Pt verbalized understanding of and agreement with plan and is safe for discharge home at this time.  No complaints prior to discharge.  Final Clinical Impressions(s) / ED Diagnoses   Final diagnoses:  Cough    ED Discharge Orders        Ordered    benzonatate (TESSALON) 100 MG capsule  Every 8 hours     03/23/17 1221       Michela Pitcher A, PA-C 03/23/17 1223    Samuel Jester, DO 03/25/17 1304

## 2017-03-28 ENCOUNTER — Other Ambulatory Visit: Payer: Self-pay

## 2017-03-28 ENCOUNTER — Emergency Department (HOSPITAL_COMMUNITY)
Admission: EM | Admit: 2017-03-28 | Discharge: 2017-03-28 | Disposition: A | Discharge status: Home / Self Care | Payer: Self-pay | Attending: Emergency Medicine | Admitting: Emergency Medicine

## 2017-03-28 ENCOUNTER — Encounter (HOSPITAL_COMMUNITY): Payer: Self-pay | Admitting: Emergency Medicine

## 2017-03-28 DIAGNOSIS — R059 Cough, unspecified: Secondary | ICD-10-CM

## 2017-03-28 DIAGNOSIS — F17210 Nicotine dependence, cigarettes, uncomplicated: Secondary | ICD-10-CM | POA: Insufficient documentation

## 2017-03-28 DIAGNOSIS — R05 Cough: Secondary | ICD-10-CM | POA: Insufficient documentation

## 2017-03-28 MED ORDER — ALBUTEROL SULFATE HFA 108 (90 BASE) MCG/ACT IN AERS
2.0000 | INHALATION_SPRAY | RESPIRATORY_TRACT | Status: DC | PRN
  Administered 2017-03-28: 2 via RESPIRATORY_TRACT
  Filled 2017-03-28: qty 6.7

## 2017-03-28 MED ORDER — PREDNISONE 50 MG PO TABS
60.0000 mg | ORAL_TABLET | Freq: Once | ORAL | Status: AC
  Administered 2017-03-28: 60 mg via ORAL
  Filled 2017-03-28: qty 1

## 2017-03-28 MED ORDER — PREDNISONE 10 MG PO TABS: ORAL_TABLET | ORAL | 0 refills | Status: DC

## 2017-03-28 MED ORDER — PROMETHAZINE-CODEINE 6.25-10 MG/5ML PO SYRP: 5.0000 mL | ORAL_SOLUTION | ORAL | 0 refills | Status: DC | PRN

## 2017-03-28 MED ORDER — FAMOTIDINE 20 MG PO TABS: 20.0000 mg | ORAL_TABLET | Freq: Two times a day (BID) | ORAL | 0 refills | Status: DC

## 2017-03-28 NOTE — Discharge Instructions (Signed)
As discussed, cough can be triggered by many things including acid reflux, inflammation of your vocal cords or the lungs, which I suspect may be an issue given your nighttime wheezing.  He had been prescribed medications to treat you for these conditions.  Use the Pepcid twice daily to rule out acid reflux as being the source of your symptoms.  Take your next dose of prednisone tomorrow evening.  This is a tapering dose and you have had your first dose of this today, this medication will help with inflammation and wheezing.  If you are coughing, short of breath or you wake up wheezing you may take 2 puffs of the inhaler given as instructed here.  You may also take the Phenergan with codeine syrup as prescribed if needed for cough suppression.  This medication will make you drowsy so do not drive within 4 hours of taking this medication.

## 2017-03-28 NOTE — ED Triage Notes (Signed)
Pt seen on 03-23-17 for continued cough. Pt states cough medication not working.

## 2017-03-29 NOTE — ED Provider Notes (Signed)
[facility] Provider Note   CSN: [id number] Arrival date & time: 03/28/17  1058     History   Chief Complaint Chief Complaint  Patient presents with  . Cough    HPI Thomas [name] is a 37 y.o. male with a history significant for episodic sinusitis and acid reflux disease, smokes 1 ppd presenting with 1+ month history of chronic persistent mostly nonproductive cough along with throat pain (with coughing only) and hoarseness of voice.  His symptoms are worsened at night, states frequently wakes up after hours of sleeping with a cough spell then is unable to return to sleep.  He denies sx of water brash or frank reflux but has occasional episodes of heartburn which he treats with Tums.  States he used to be on nexium but has stopped taking this when he lost his insurance with the loss of his job.  He denies fevers or chills, denies shortness of breath.  Wife has noted wheezing when he wakes with these cough spells. He was seen for this earlier this month and treated with tessalon which was not effective.  The history is provided by the patient.    Past Medical History:  Diagnosis Date  . Reflux   . Sinusitis     Patient Active Problem List   Diagnosis Date Noted  . 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 No pertinent surgical history.     Home Medications    Prior to Admission medications   Medication Sig Start Date End Date Taking? Authorizing Provider  benzonatate (TESSALON) 100 MG capsule Take 1 capsule (100 mg total) by mouth every 8 (eight) hours. 03/23/17   Fawze, Mina A, PA-C  cetirizine (ZYRTEC ALLERGY) 10 MG tablet Take 1 tablet (10 mg total) by mouth daily. 11/15/16   Wallis BambergMani, Mario, PA-C  Chlorphen-Pseudoephed-APAP [facility](THERAFLU FLU/COLD PO) Take by mouth.    [provider]  famotidine (PEPCID) 20 MG tablet Take 1 tablet (20 mg total) by mouth 2 (two) times daily. 03/28/17   Travante Knee, Raynelle FanningJulie, PA-C  guaiFENesin (MUCINEX) 600 MG 12 hr  tablet Take by mouth 2 (two) times daily.    [provider]  HYDROcodone-homatropine (HYCODAN) 5-1.5 MG/5ML syrup Take 5 mLs by mouth at bedtime as needed. 11/15/16   Wallis BambergMani, Mario, PA-C  predniSONE (DELTASONE) 10 MG tablet Take 6 tablets day one, 5 tablets day two, 4 tablets day three, 3 tablets day four, 2 tablets day five, then 1 tablet day six 03/29/17   Dimetrius Montfort, Raynelle FanningJulie, PA-C  promethazine-codeine (PHENERGAN WITH CODEINE) 6.25-10 MG/5ML syrup Take 5 mLs by mouth every 4 (four) hours as needed for cough. 03/28/17   Khandi Kernes, Raynelle FanningJulie, PA-C  pseudoephedrine (SUDAFED 12 HOUR) 120 MG 12 hr tablet Take 1 tablet (120 mg total) by mouth 2 (two) times daily. 11/15/16   Wallis BambergMani, Mario, PA-C    Family History Family History  Problem Relation Age of Onset  . Breast cancer Mother   . Pneumonia Father   . COPD Sister   . Diabetes Mellitus I Maternal Grandmother   . Diabetes Mellitus I Paternal Grandmother   . Heart disease Paternal Grandfather     Social History Social History   Tobacco Use  . Smoking status: Current Every Day Smoker    Packs/day: 1.00    Years: 15.00    Pack years: 15.00  . Smokeless tobacco: Never Used  Substance Use Topics  . Alcohol use: Yes    Comment: occasionally  . Drug use: No  Allergies   Penicillins   Review of Systems Review of Systems  Constitutional: Negative for fever.  HENT: Negative for congestion and sore throat.   Eyes: Negative.   Respiratory: Positive for cough and wheezing. Negative for chest tightness and shortness of breath.   Cardiovascular: Negative for chest pain.  Gastrointestinal: Negative for abdominal pain, nausea and vomiting.  Genitourinary: Negative.   Musculoskeletal: Negative for arthralgias, joint swelling and neck pain.  Skin: Negative.  Negative for rash and wound.  Neurological: Negative for dizziness, weakness, light-headedness, numbness and headaches.  Psychiatric/Behavioral: Negative.      Physical Exam Updated Vital  Signs BP (!) 146/89 (BP Location: Right Arm)   Pulse (!) 101   Temp 98.1 F (36.7 C) (Oral)   Resp 16   Ht 5\' 9"  (1.753 m)   Wt 91.6 kg (202 lb)   SpO2 97%   BMI 29.83 kg/m   Physical Exam  Constitutional: He appears well-developed and well-nourished.  HENT:  Head: Normocephalic and atraumatic.  hoarseness of voice noted  Eyes: Conjunctivae are normal.  Neck: Normal range of motion.  Cardiovascular: Normal rate, regular rhythm, normal heart sounds and intact distal pulses.  Pulmonary/Chest: Effort normal and breath sounds normal. No stridor. No respiratory distress. He has no wheezes.  Frequent dry cough  Abdominal: Soft. Bowel sounds are normal. There is no tenderness.  Musculoskeletal: Normal range of motion.  Lymphadenopathy:    He has no cervical adenopathy.  Neurological: He is alert.  Skin: Skin is warm and dry.  Psychiatric: He has a normal mood and affect.  Nursing note and vitals reviewed.    ED Treatments / Results  Labs (all labs ordered are listed, but only abnormal results are displayed) Labs Reviewed - No data to display  EKG  EKG Interpretation None       Radiology No results found.  Procedures Procedures (including critical care time)  Medications Ordered in ED Medications  predniSONE (DELTASONE) tablet 60 mg (60 mg Oral Given 03/28/17 1453)     Initial Impression / Assessment and Plan / ED Course  I have reviewed the triage vital signs and the nursing notes.  Pertinent labs & imaging results that were available during my care of the patient were reviewed by me and considered in my medical decision making (see chart for details).     Pt with persistent dry cough of unclear etiology.  He had a chest x-ray at his last ED visit here this month which was reviewed, no indication for repeating this today.  Differential diagnosis includes simply residual cough status post URI, with hoarseness of voice this could be a GERD equivalent, history  suggesting a wheeze component intermittently, although no wheeze heard on today's exam.  He was placed on several medications to cover him for these possibilities including a prednisone taper, he was given an albuterol MDI with a spacer and instructed in its use.  Pepcid was also prescribed to help rule out the GERD etiology.  Was also given Phenergan with codeine for symptom relief, caution regarding sedation discussed.  He was given referrals for follow-up care.  We also discussed smoking cessation which he states he is working on.   Final Clinical Impressions(s) / ED Diagnoses   Final diagnoses:  Cough    ED Discharge Orders        Ordered    predniSONE (DELTASONE) 10 MG tablet     03/28/17 1431    promethazine-codeine (PHENERGAN WITH CODEINE) 6.25-10 MG/5ML syrup  Every 4 hours PRN     03/28/17 1431    famotidine (PEPCID) 20 MG tablet  2 times daily     03/28/17 1431       Burgess Amor, Cordelia Poche 03/29/17 1610    Marily Memos, MD 03/29/17 1535

## 2017-08-12 ENCOUNTER — Other Ambulatory Visit: Payer: Self-pay

## 2017-08-12 ENCOUNTER — Emergency Department (HOSPITAL_COMMUNITY): Payer: Self-pay

## 2017-08-12 ENCOUNTER — Encounter (HOSPITAL_COMMUNITY): Payer: Self-pay | Admitting: Emergency Medicine

## 2017-08-12 ENCOUNTER — Emergency Department (HOSPITAL_COMMUNITY)
Admission: EM | Admit: 2017-08-12 | Discharge: 2017-08-12 | Disposition: A | Discharge status: Home / Self Care | Payer: Self-pay | Attending: Emergency Medicine | Admitting: Emergency Medicine

## 2017-08-12 DIAGNOSIS — N132 Hydronephrosis with renal and ureteral calculous obstruction: Secondary | ICD-10-CM | POA: Insufficient documentation

## 2017-08-12 DIAGNOSIS — Z79899 Other long term (current) drug therapy: Secondary | ICD-10-CM | POA: Insufficient documentation

## 2017-08-12 DIAGNOSIS — F17200 Nicotine dependence, unspecified, uncomplicated: Secondary | ICD-10-CM | POA: Insufficient documentation

## 2017-08-12 LAB — URINALYSIS, ROUTINE W REFLEX MICROSCOPIC
BILIRUBIN URINE: NEGATIVE
Bacteria, UA: NONE SEEN
GLUCOSE, UA: NEGATIVE mg/dL
KETONES UR: NEGATIVE mg/dL
LEUKOCYTES UA: NEGATIVE
NITRITE: NEGATIVE
Protein, ur: NEGATIVE mg/dL
SPECIFIC GRAVITY, URINE: 1.016 (ref 1.005–1.030)
pH: 6 (ref 5.0–8.0)

## 2017-08-12 LAB — COMPREHENSIVE METABOLIC PANEL
ALBUMIN: 4.1 g/dL (ref 3.5–5.0)
ALT: 22 U/L (ref 0–44)
AST: 18 U/L (ref 15–41)
Alkaline Phosphatase: 85 U/L (ref 38–126)
Anion gap: 8 (ref 5–15)
BILIRUBIN TOTAL: 0.6 mg/dL (ref 0.3–1.2)
BUN: 10 mg/dL (ref 6–20)
CO2: 26 mmol/L (ref 22–32)
Calcium: 9.8 mg/dL (ref 8.9–10.3)
Chloride: 109 mmol/L (ref 98–111)
Creatinine, Ser: 1.04 mg/dL (ref 0.61–1.24)
GFR calc Af Amer: >60 mL/min (ref >60)
GFR calc non Af Amer: >60 mL/min (ref >60)
GLUCOSE: 124 mg/dL — AB (ref 70–99)
POTASSIUM: 4.1 mmol/L (ref 3.5–5.1)
Sodium: 143 mmol/L (ref 135–145)
TOTAL PROTEIN: 6.9 g/dL (ref 6.5–8.1)

## 2017-08-12 LAB — LIPASE, BLOOD: Lipase: 29 U/L (ref 11–51)

## 2017-08-12 LAB — CBC
HEMATOCRIT: 47.3 % (ref 39.0–52.0)
Hemoglobin: 15.5 g/dL (ref 13.0–17.0)
MCH: 28.8 pg (ref 26.0–34.0)
MCHC: 32.8 g/dL (ref 30.0–36.0)
MCV: 87.9 fL (ref 78.0–100.0)
Platelets: 276 10*3/uL (ref 150–400)
RBC: 5.38 MIL/uL (ref 4.22–5.81)
RDW: 13.7 % (ref 11.5–15.5)
WBC: 11.6 10*3/uL — AB (ref 4.0–10.5)

## 2017-08-12 MED ORDER — ACETAMINOPHEN 500 MG PO TABS: 500.0000 mg | ORAL_TABLET | Freq: Four times a day (QID) | ORAL | 0 refills | Status: DC | PRN

## 2017-08-12 MED ORDER — OXYCODONE HCL 5 MG PO TABS: 5.0000 mg | ORAL_TABLET | ORAL | 0 refills | Status: DC | PRN

## 2017-08-12 MED ORDER — IBUPROFEN 600 MG PO TABS: 600.0000 mg | ORAL_TABLET | Freq: Four times a day (QID) | ORAL | 0 refills | Status: DC | PRN

## 2017-08-12 NOTE — ED Notes (Signed)
Pt still unable to give urine sample at this time.

## 2017-08-12 NOTE — ED Triage Notes (Signed)
Patient to ED c/o intermittent L flank/LUQ pain that radiates to his L side of back x 2 days. Denies N/V/D, no urinary symptoms or fevers. Pain not worse with movement.

## 2017-08-12 NOTE — Discharge Instructions (Addendum)
You have a 2 x 3 mm kidney stone in your left ureter.  Alternate 600 mg of ibuprofen and [phone] mg of Tylenol every 3 hours as needed for pain. Do not exceed 4000 mg of Tylenol daily.  Take ibuprofen with food to avoid upset stomach issues.  Take oxycodone as needed for severe pain but do not drive, drink alcohol, or operate heavy machinery while taking this medication as it may make you drowsy.  You may also cut these tablets in half.  Follow-up with urology for reevaluation of your symptoms.  Return to the emergency department if any concerning signs or symptoms develop such as fever, persistent vomiting, worsening uncontrolled pain,  or difficulty urinating.

## 2017-08-12 NOTE — ED Provider Notes (Signed)
[facility] Provider Note   CSN: [id number] Arrival date & time: 08/12/17  1206     History   Chief Complaint Chief Complaint  Patient presents with  . Flank Pain    HPI Doroteo BradfordBenjamin Imperato is a 37 y.o. male with history of reflux and sinusitis presents for evaluation of acute onset, intermittent left upper quadrant and left flank pain for 2 days.  Pain is intermittent, throbbing and radiates down the left side of the abdomen at times into the left testicle.  No aggravating or alleviating factors noted.  The patient is unsure what brings on his symptoms.  He has had 2 episodes of watery nonbloody diarrhea, denies nausea or vomiting.  Denies urinary urgency, frequency, dysuria, or hematuria.  No fevers or chills.  Denies chest pain or shortness of breath.  He has tried Gas-X and ibuprofen without relief of his symptoms.  He denies any recent trauma or falls.  He does do a lot of bending and some lifting at his work.  Denies numbness, tingling, weakness, saddle anesthesia, bowel or bladder incontinence, or history of IV drug use.  The history is provided by the patient.    Past Medical History:  Diagnosis Date  . Reflux   . Sinusitis     Patient Active Problem List   Diagnosis Date Noted  . 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 No pertinent surgical history.      Home Medications    Prior to Admission medications   Medication Sig Start Date End Date Taking? Authorizing Provider  acetaminophen (TYLENOL) 500 MG tablet Take 1 tablet (500 mg total) by mouth every 6 (six) hours as needed. 08/12/17   Mena Lienau A, PA-C  benzonatate (TESSALON) 100 MG capsule Take 1 capsule (100 mg total) by mouth every 8 (eight) hours. 03/23/17   Cami Delawder A, PA-C  cetirizine (ZYRTEC ALLERGY) 10 MG tablet Take 1 tablet (10 mg total) by mouth daily. 11/15/16   Wallis BambergMani, Mario, PA-C  Chlorphen-Pseudoephed-APAP [facility](THERAFLU FLU/COLD PO) Take by mouth.    [provider]  famotidine (PEPCID) 20 MG tablet Take 1 tablet (20 mg total) by mouth 2 (two) times daily. 03/28/17   Idol, Raynelle FanningJulie, PA-C  guaiFENesin (MUCINEX) 600 MG 12 hr tablet Take by mouth 2 (two) times daily.    [provider]  HYDROcodone-homatropine (HYCODAN) 5-1.5 MG/5ML syrup Take 5 mLs by mouth at bedtime as needed. 11/15/16   Wallis BambergMani, Mario, PA-C  ibuprofen (ADVIL,MOTRIN) 600 MG tablet Take 1 tablet (600 mg total) by mouth every 6 (six) hours as needed. 08/12/17   Ernestyne Caldwell A, PA-C  oxyCODONE (ROXICODONE) 5 MG immediate release tablet Take 1 tablet (5 mg total) by mouth every 4 (four) hours as needed for severe pain. 08/12/17   Luevenia MaxinFawze, Meilech Virts A, PA-C  predniSONE (DELTASONE) 10 MG tablet Take 6 tablets day one, 5 tablets day two, 4 tablets day three, 3 tablets day four, 2 tablets day five, then 1 tablet day six 03/29/17   Idol, Raynelle FanningJulie, PA-C  promethazine-codeine (PHENERGAN WITH CODEINE) 6.25-10 MG/5ML syrup Take 5 mLs by mouth every 4 (four) hours as needed for cough. 03/28/17   Idol, Raynelle FanningJulie, PA-C  pseudoephedrine (SUDAFED 12 HOUR) 120 MG 12 hr tablet Take 1 tablet (120 mg total) by mouth 2 (two) times daily. 11/15/16   Wallis BambergMani, Mario, PA-C    Family History Family History  Problem Relation Age of Onset  . Breast cancer Mother   . Pneumonia  Father   . COPD Sister   . Diabetes Mellitus I Maternal Grandmother   . Diabetes Mellitus I Paternal Grandmother   . Heart disease Paternal Grandfather     Social History Social History   Tobacco Use  . Smoking status: Current Every Day Smoker    Packs/day: 1.00    Years: 15.00    Pack years: 15.00  . Smokeless tobacco: Never Used  Substance Use Topics  . Alcohol use: Yes    Comment: occasionally  . Drug use: No     Allergies   Penicillins   Review of Systems Review of Systems  Constitutional: Negative for chills and fever.  Respiratory: Negative for shortness of breath.   Cardiovascular: Negative for chest pain.  Gastrointestinal:  Positive for abdominal pain and diarrhea. Negative for blood in stool, constipation, nausea and vomiting.  Genitourinary: Positive for flank pain. Negative for dysuria, frequency, hematuria and urgency.  All other systems reviewed and are negative.    Physical Exam Updated Vital Signs BP 128/77   Pulse 60   Temp 98.5 F (36.9 C) (Oral)   Resp 16   SpO2 99%   Physical Exam  Constitutional: He appears well-developed and well-nourished. No distress.  HENT:  Head: Normocephalic and atraumatic.  Eyes: Conjunctivae are normal. Right eye exhibits no discharge. Left eye exhibits no discharge.  Neck: No JVD present. No tracheal deviation present.  Cardiovascular: Normal rate, regular rhythm and normal heart sounds.  Pulmonary/Chest: Effort normal and breath sounds normal.  Abdominal: Soft. Bowel sounds are normal. He exhibits no distension. There is tenderness. There is no guarding.  Mild tenderness to palpation of the left lower quadrant near the pelvic brim.  Murphy sign absent, Rovsing's absent, no CVA tenderness  Musculoskeletal: He exhibits no edema.  No midline spine TTP, no paraspinal muscle tenderness, no deformity, crepitus, or step-off noted   Neurological: He is alert.  Skin: Skin is warm and dry. No erythema.  Psychiatric: He has a normal mood and affect. His behavior is normal.  Nursing note and vitals reviewed.    ED Treatments / Results  Labs (all labs ordered are listed, but only abnormal results are displayed) Labs Reviewed  COMPREHENSIVE METABOLIC PANEL - Abnormal; Notable for the following components:      Result Value   Glucose, Bld 124 (*)    All other components within normal limits  CBC - Abnormal; Notable for the following components:   WBC 11.6 (*)    All other components within normal limits  URINALYSIS, ROUTINE W REFLEX MICROSCOPIC - Abnormal; Notable for the following components:   Hgb urine dipstick MODERATE (*)    All other components within normal  limits  LIPASE, BLOOD    EKG None  Radiology No results found.  Procedures Procedures (including critical care time)  Medications Ordered in ED Medications - No data to display   Initial Impression / Assessment and Plan / ED Course  I have reviewed the triage vital signs and the nursing notes.  Pertinent labs & imaging results that were available during my care of the patient were reviewed by me and considered in my medical decision making (see chart for details).     Patient presents with 2-day history of colicky left sided abdominal and flank pain.  He is afebrile, vital signs are stable.  He is nontoxic in appearance.  No peritoneal signs on examination of the abdomen.  Lab work reviewed by me shows very mild nonspecific leukocytosis, no electrolyte abnormalities.  Creatinine, lipase, and LFTs are within normal limits.  He has some hematuria on UA suggestive of nephrolithiasis, no evidence of UTI.  CT renal stone study shows a 3 x 2 mm calculus in the proximal left ureter with some resulting mild hydronephrosis.  No other acute abnormalities noted although he does have a small ventral hernia containing only fat.  He declined any pain medications while in the ED.  He is tolerating p.o. fluids in the ED without difficulty and serial abdominal examinations remain benign.  No evidence of obstruction, perforation, appendicitis, colitis, AAA or other acute surgical abdominal pathology.  Will discharge home with NSAIDs, Tylenol, and oxycodone as needed for severe breakthrough pain.  [place] controlled substance registry was queried and patient has no conflicting prescriptions.  He will follow-up with urology on an outpatient basis.  Discussed strict ED return precautions.  Patient and patient's significant other verbalized understanding of and agreement with plan and patient is stable for discharge home at this time.  Final Clinical Impressions(s) / ED Diagnoses   Final diagnoses:    Ureteral stone with hydronephrosis    ED Discharge Orders        Ordered    acetaminophen (TYLENOL) 500 MG tablet  Every 6 hours PRN     08/12/17 1549    ibuprofen (ADVIL,MOTRIN) 600 MG tablet  Every 6 hours PRN     08/12/17 1549    oxyCODONE (ROXICODONE) 5 MG immediate release tablet  Every 4 hours PRN     08/12/17 1549       Shakeita Vandevander, [place] A, PA-C 08/15/17 1038    Rolan Bucco, MD 08/15/17 1252

## 2019-01-17 ENCOUNTER — Ambulatory Visit
Admission: EM | Admit: 2019-01-17 | Discharge: 2019-01-17 | Disposition: A | Discharge status: Home / Self Care | Payer: Self-pay | Attending: Urgent Care | Admitting: Urgent Care

## 2019-01-17 ENCOUNTER — Other Ambulatory Visit: Payer: Self-pay

## 2019-01-17 DIAGNOSIS — R319 Hematuria, unspecified: Secondary | ICD-10-CM | POA: Insufficient documentation

## 2019-01-17 DIAGNOSIS — N23 Unspecified renal colic: Secondary | ICD-10-CM | POA: Insufficient documentation

## 2019-01-17 DIAGNOSIS — R109 Unspecified abdominal pain: Secondary | ICD-10-CM | POA: Insufficient documentation

## 2019-01-17 DIAGNOSIS — Z87442 Personal history of urinary calculi: Secondary | ICD-10-CM | POA: Insufficient documentation

## 2019-01-17 HISTORY — DX: Disorder of kidney and ureter, unspecified: N28.9

## 2019-01-17 LAB — POCT URINALYSIS DIP (MANUAL ENTRY)
Bilirubin, UA: NEGATIVE
Glucose, UA: NEGATIVE mg/dL
Ketones, POC UA: NEGATIVE mg/dL
Nitrite, UA: NEGATIVE
Protein Ur, POC: 30 mg/dL — AB
Spec Grav, UA: >=1.03 — AB (ref 1.010–1.025)
Urobilinogen, UA: 0.2 E.U./dL
pH, UA: 7.5 (ref 5.0–8.0)

## 2019-01-17 MED ORDER — CEPHALEXIN 250 MG PO CAPS: 250.0000 mg | ORAL_CAPSULE | Freq: Three times a day (TID) | ORAL | 0 refills | Status: DC

## 2019-01-17 MED ORDER — HYDROCODONE-ACETAMINOPHEN 5-325 MG PO TABS: 1.0000 | ORAL_TABLET | Freq: Three times a day (TID) | ORAL | 0 refills | Status: AC | PRN

## 2019-01-17 MED ORDER — TAMSULOSIN HCL 0.4 MG PO CAPS: 0.4000 mg | ORAL_CAPSULE | Freq: Every day | ORAL | 0 refills | Status: DC

## 2019-01-17 NOTE — ED Triage Notes (Signed)
Pt reports history of kidney stones.  Reports intermittent pain in left flank x 1 month.  Reports noticing blood in urine x 1 week and burning with urination x 3 weeks.  Denies n/v.

## 2019-01-17 NOTE — ED Provider Notes (Signed)
[place]     MRN: [id number] DOB: 1980-10-10  Subjective:   Bryley Kovacevic is a 38 y.o. male presenting for 1 month history of acute onset of recurrent moderate dysuria, left-sided intermittent flank pain.  This past week, patient started noticing hematuria.  Has a history of renal stone last episode was about 1 year ago and was severe.  States that he was found to have about a 3 cm renal stone.  He does not see a urologist.  Hydrates inconsistently and drinks green tea.  No current facility-administered medications for this encounter.   Current Outpatient Medications:  .  ibuprofen (ADVIL,MOTRIN) 600 MG tablet, Take 1 tablet (600 mg total) by mouth every 6 (six) hours as needed., Disp: 30 tablet, Rfl: 0 .  acetaminophen (TYLENOL) 500 MG tablet, Take 1 tablet (500 mg total) by mouth every 6 (six) hours as needed., Disp: 30 tablet, Rfl: 0 .  benzonatate (TESSALON) 100 MG capsule, Take 1 capsule (100 mg total) by mouth every 8 (eight) hours., Disp: 30 capsule, Rfl: 0 .  cetirizine (ZYRTEC ALLERGY) 10 MG tablet, Take 1 tablet (10 mg total) by mouth daily., Disp: 30 tablet, Rfl: 0 .  Chlorphen-Pseudoephed-APAP (THERAFLU FLU/COLD PO), Take by mouth., Disp: , Rfl:  .  famotidine (PEPCID) 20 MG tablet, Take 1 tablet (20 mg total) by mouth 2 (two) times daily., Disp: 30 tablet, Rfl: 0 .  guaiFENesin (MUCINEX) 600 MG 12 hr tablet, Take by mouth 2 (two) times daily., Disp: , Rfl:  .  HYDROcodone-homatropine (HYCODAN) 5-1.5 MG/5ML syrup, Take 5 mLs by mouth at bedtime as needed., Disp: 120 mL, Rfl: 0 .  oxyCODONE (ROXICODONE) 5 MG immediate release tablet, Take 1 tablet (5 mg total) by mouth every 4 (four) hours as needed for severe pain., Disp: 15 tablet, Rfl: 0 .  predniSONE (DELTASONE) 10 MG tablet, Take 6 tablets day one, 5 tablets day two, 4 tablets day three, 3 tablets day four, 2 tablets day five, then 1 tablet day six, Disp: 21 tablet, Rfl: 0 .  promethazine-codeine  (PHENERGAN WITH CODEINE) 6.25-10 MG/5ML syrup, Take 5 mLs by mouth every 4 (four) hours as needed for cough., Disp: 120 mL, Rfl: 0 .  pseudoephedrine (SUDAFED 12 HOUR) 120 MG 12 hr tablet, Take 1 tablet (120 mg total) by mouth 2 (two) times daily., Disp: 30 tablet, Rfl: 3   Allergies  Allergen Reactions  . Penicillins Rash    Past Medical History:  Diagnosis Date  . Reflux   . Renal disorder    kidney stones  . Sinusitis      History reviewed. No pertinent surgical history.  Family History  Problem Relation Age of Onset  . Breast cancer Mother   . Pneumonia Father   . COPD Sister   . Diabetes Mellitus I Maternal Grandmother   . Diabetes Mellitus I Paternal Grandmother   . Heart disease Paternal Grandfather     Social History   Tobacco Use  . Smoking status: Current Every Day Smoker    Packs/day: 1.00    Years: 15.00    Pack years: 15.00  . Smokeless tobacco: Never Used  Substance Use Topics  . Alcohol use: Yes    Comment: occasionally  . Drug use: No    Review of Systems  Constitutional: Negative for fever and malaise/fatigue.  HENT: Negative for congestion, ear pain, sinus pain and sore throat.   Eyes: Negative for blurred vision, double vision, discharge and redness.  Respiratory: Negative for cough,  hemoptysis, shortness of breath and wheezing.   Cardiovascular: Negative for chest pain.  Gastrointestinal: Negative for abdominal pain, diarrhea, nausea and vomiting.  Genitourinary: Positive for dysuria, flank pain and hematuria.  Musculoskeletal: Negative for myalgias.  Skin: Negative for rash.  Neurological: Negative for dizziness, weakness and headaches.  Psychiatric/Behavioral: Negative for depression and substance abuse.     Objective:   Vitals: BP (!) 148/77 (BP Location: Right Arm)   Pulse 100   Temp 98.2 F (36.8 C) (Oral)   Resp 18   SpO2 94%   Physical Exam Constitutional:      General: He is not in acute distress.    Appearance: Normal  appearance. He is well-developed. He is not ill-appearing, toxic-appearing or diaphoretic.  HENT:     Head: Normocephalic and atraumatic.     Right Ear: External ear normal.     Left Ear: External ear normal.     Nose: Nose normal.     Mouth/Throat:     Mouth: Mucous membranes are moist.     Pharynx: Oropharynx is clear.  Eyes:     General: No scleral icterus.    Extraocular Movements: Extraocular movements intact.     Pupils: Pupils are equal, round, and reactive to light.  Neck:     Musculoskeletal: Normal range of motion and neck supple.  Cardiovascular:     Rate and Rhythm: Normal rate and regular rhythm.     Heart sounds: Normal heart sounds. No murmur. No friction rub. No gallop.   Pulmonary:     Effort: Pulmonary effort is normal. No respiratory distress.     Breath sounds: Normal breath sounds. No stridor. No wheezing, rhonchi or rales.  Abdominal:     General: Bowel sounds are normal. There is no distension.     Palpations: Abdomen is soft. There is no mass.     Tenderness: There is no abdominal tenderness. There is no right CVA tenderness, left CVA tenderness, guarding or rebound.  Skin:    General: Skin is warm and dry.  Neurological:     Mental Status: He is alert and oriented to person, place, and time.     Cranial Nerves: No cranial nerve deficit.     Motor: No weakness.     Coordination: Coordination normal.     Deep Tendon Reflexes: Reflexes normal.  Psychiatric:        Mood and Affect: Mood normal.        Behavior: Behavior normal.        Thought Content: Thought content normal.        Judgment: Judgment normal.     Results for orders placed or performed during the hospital encounter of 01/17/19 (from the past 24 hour(s))  POCT urinalysis dipstick     Status: Abnormal   Collection Time: 01/17/19  9:59 AM  Result Value Ref Range   Color, UA yellow yellow   Clarity, UA clear clear   Glucose, UA negative negative mg/dL   Bilirubin, UA negative negative    Ketones, POC UA negative negative mg/dL   Spec Grav, UA >=6.578>=1.030 (A) 1.010 - 1.025   Blood, UA moderate (A) negative   pH, UA 7.5 5.0 - 8.0   Protein Ur, POC =30 (A) negative mg/dL   Urobilinogen, UA 0.2 0.2 or 1.0 E.U./dL   Nitrite, UA Negative Negative   Leukocytes, UA Small (1+) (A) Negative    Assessment and Plan :   1. Hematuria, unspecified type   2. Left flank  pain   3. History of renal stone   4. Renal colic on left side     We will manage for renal colic with hydrocodone.  Recommended patient hydrate aggressively with plain water.  We will have him start Flomax.  Vital signs and physical exam findings reassuring and stable for discharge.  Patient is to contact urologist in [place] for consult. Counseled patient on potential for adverse effects with medications prescribed/recommended today, ER and return-to-clinic precautions discussed, patient verbalized understanding.    Wallis Bamberg, PA-C 01/17/19 1155

## 2019-01-18 LAB — URINE CULTURE: Culture: NO GROWTH

## 2020-06-06 ENCOUNTER — Ambulatory Visit: Payer: Self-pay | Admitting: Urology

## 2020-06-09 ENCOUNTER — Ambulatory Visit: Payer: Self-pay | Admitting: Urology

## 2020-06-10 ENCOUNTER — Ambulatory Visit
Admission: RE | Admit: 2020-06-10 | Discharge: 2020-06-10 | Disposition: A | Discharge status: Home / Self Care | Payer: Self-pay | Source: Ambulatory Visit | Attending: Urology | Admitting: Urology

## 2020-06-10 ENCOUNTER — Ambulatory Visit (INDEPENDENT_AMBULATORY_CARE_PROVIDER_SITE_OTHER): Payer: No Typology Code available for payment source | Admitting: Urology

## 2020-06-10 ENCOUNTER — Encounter: Payer: Self-pay | Admitting: Urology

## 2020-06-10 ENCOUNTER — Other Ambulatory Visit: Payer: Self-pay

## 2020-06-10 ENCOUNTER — Ambulatory Visit
Admission: RE | Admit: 2020-06-10 | Discharge: 2020-06-10 | Disposition: A | Discharge status: Home / Self Care | Payer: Self-pay | Attending: Urology | Admitting: Urology

## 2020-06-10 ENCOUNTER — Other Ambulatory Visit
Admission: RE | Admit: 2020-06-10 | Discharge: 2020-06-10 | Disposition: A | Discharge status: Home / Self Care | Payer: Self-pay | Source: Home / Self Care | Attending: Urology | Admitting: Urology

## 2020-06-10 ENCOUNTER — Other Ambulatory Visit: Payer: Self-pay | Admitting: *Deleted

## 2020-06-10 VITALS — BP 131/89 | HR 83 | Ht 69.0 in | Wt 211.0 lb

## 2020-06-10 DIAGNOSIS — N200 Calculus of kidney: Secondary | ICD-10-CM | POA: Diagnosis not present

## 2020-06-10 DIAGNOSIS — R300 Dysuria: Secondary | ICD-10-CM | POA: Diagnosis not present

## 2020-06-10 DIAGNOSIS — N210 Calculus in bladder: Secondary | ICD-10-CM | POA: Diagnosis not present

## 2020-06-10 LAB — URINALYSIS, COMPLETE (UACMP) WITH MICROSCOPIC
Bilirubin Urine: NEGATIVE
Glucose, UA: NEGATIVE mg/dL
Hgb urine dipstick: NEGATIVE
Leukocytes,Ua: NEGATIVE
Nitrite: NEGATIVE
Protein, ur: NEGATIVE mg/dL
Specific Gravity, Urine: 1.025 (ref 1.005–1.030)
pH: 6 (ref 5.0–8.0)

## 2020-06-10 LAB — BLADDER SCAN AMB NON-IMAGING

## 2020-06-10 NOTE — Progress Notes (Signed)
   06/10/20 9:32 AM   [name] 1980/09/29 [id number]  CC: Urinary urgency/frequency/dysuria, bladder stone  HPI: I saw Mr. Ruben in urology clinic today for the above issues.  He is a 40 year old otherwise healthy male who reports 5 to 6 months of burning with urination, frequency, urgency, and leakage.  PVR is normal today at 37 mL.  He reportedly had a CT about a year ago that showed a 1 cm bladder stone, and he did not follow-up.  Those images are unavailable for me to personally review.  He has a history of 1 kidney stone that did not require surgery.  I reviewed his CT from 2019 that shows no bladder stones.  He denies any prior urologic surgeries.  Urinalysis today with few bacteria, 6-10 WBCs, WBC clumps, 0 RBCs, nitrite negative, no leukocytes.   PMH: Past Medical History:  Diagnosis Date  . Reflux   . Renal disorder    kidney stones  . Sinusitis     Family History: Family History  Problem Relation Age of Onset  . Breast cancer Mother   . Pneumonia Father   . COPD Sister   . Diabetes Mellitus I Maternal Grandmother   . Diabetes Mellitus I Paternal Grandmother   . Heart disease Paternal Grandfather     Social History:  reports that he has been smoking. He has a 15.00 pack-year smoking history. He has never used smokeless tobacco. He reports current alcohol use. He reports that he does not use drugs.  Physical Exam: BP 131/89   Pulse 83   Ht 5\' 9"  (1.753 m)   Wt 211 lb (95.7 kg)   BMI 31.16 kg/m    Constitutional:  Alert and oriented, No acute distress. Cardiovascular: No clubbing, cyanosis, or edema. Respiratory: Normal respiratory effort, no increased work of breathing. GI: Abdomen is soft, nontender, nondistended, no abdominal masses GU: Phallus with patent meatus, no lesions, testicles 20 cc and descended bilaterally  Laboratory Data: Reviewed, see HPI  Pertinent Imaging: I have personally viewed and interpreted the CT from 2019 showing no  bladder stone.  CT from March 2021 in Care Every[facility] at [facility] report suggests a 1 cm bladder stone and no hydronephrosis.  Assessment & Plan:   40 year old male with significant urinary symptoms of dysuria, urgency, frequency, and CT 1 year ago suggesting a 1 cm bladder stone.  We discussed possible etiologies at length, as well as possible additional issues like urethral stricture disease or BPH.  I recommended a KUB today to evaluate for a possible bladder stone.  We discussed options would include cystoscopy in clinic to evaluate for urethral stricture and confirm a bladder stone, versus proceeding directly to the OR for cystoscopy, cystolitholapaxy, with possible additional pathology found at that time including urethral stricture that may require dilation or prolonged catheter placement.  Call with urine culture and KUB results-> will discuss findings and schedule either cystoscopy in clinic to confirm bladder stone and rule out urethral stricture, versus proceeding directly to the OR for cystolitholapaxy   44, MD 06/10/2020  [facility] [address] [place], [place] [phone]

## 2020-06-10 NOTE — H&P (View-Only) (Signed)
   06/10/20 9:32 AM   Thomas [name] 01/06/1981 [id number]  CC: Urinary urgency/frequency/dysuria, bladder stone  HPI: I saw Thomas [name] in urology clinic today for the above issues.  He is a 39-year-old otherwise healthy male who reports 5 to 6 months of burning with urination, frequency, urgency, and leakage.  PVR is normal today at 37 mL.  He reportedly had a CT about a year ago that showed a 1 cm bladder stone, and he did not follow-up.  Those images are unavailable for me to personally review.  He has a history of 1 kidney stone that did not require surgery.  I reviewed his CT from 2019 that shows no bladder stones.  He denies any prior urologic surgeries.  Urinalysis today with few bacteria, 6-10 WBCs, WBC clumps, 0 RBCs, nitrite negative, no leukocytes.   PMH: Past Medical History:  Diagnosis Date  . Reflux   . Renal disorder    kidney stones  . Sinusitis     Family History: Family History  Problem Relation Age of Onset  . Breast cancer Mother   . Pneumonia Father   . COPD Sister   . Diabetes Mellitus I Maternal Grandmother   . Diabetes Mellitus I Paternal Grandmother   . Heart disease Paternal Grandfather     Social History:  reports that he has been smoking. He has a 15.00 pack-year smoking history. He has never used smokeless tobacco. He reports current alcohol use. He reports that he does not use drugs.  Physical Exam: BP 131/89   Pulse 83   Ht 5' 9" (1.753 m)   Wt 211 lb (95.7 kg)   BMI 31.16 kg/m    Constitutional:  Alert and oriented, No acute distress. Cardiovascular: No clubbing, cyanosis, or edema. Respiratory: Normal respiratory effort, no increased work of breathing. GI: Abdomen is soft, nontender, nondistended, no abdominal masses GU: Phallus with patent meatus, no lesions, testicles 20 cc and descended bilaterally  Laboratory Data: Reviewed, see HPI  Pertinent Imaging: I have personally viewed and interpreted the CT from 2019 showing no  bladder stone.  CT from March 2021 in Care Everywhere at [facility] report suggests a 1 cm bladder stone and no hydronephrosis.  Assessment & Plan:   39-year-old male with significant urinary symptoms of dysuria, urgency, frequency, and CT 1 year ago suggesting a 1 cm bladder stone.  We discussed possible etiologies at length, as well as possible additional issues like urethral stricture disease or BPH.  I recommended a KUB today to evaluate for a possible bladder stone.  We discussed options would include cystoscopy in clinic to evaluate for urethral stricture and confirm a bladder stone, versus proceeding directly to the OR for cystoscopy, cystolitholapaxy, with possible additional pathology found at that time including urethral stricture that may require dilation or prolonged catheter placement.  Call with urine culture and KUB results-> will discuss findings and schedule either cystoscopy in clinic to confirm bladder stone and rule out urethral stricture, versus proceeding directly to the OR for cystolitholapaxy   Thomas Bhargava, MD 06/10/2020  [facility] [address], [address] [place], [place] [address] [phone]   

## 2020-06-11 LAB — URINE CULTURE: Culture: NO GROWTH

## 2020-06-13 ENCOUNTER — Telehealth: Payer: Self-pay

## 2020-06-13 NOTE — Telephone Encounter (Signed)
Called pt informed him of the information below. Pt gave verbal understanding. Pt states he will speak with manager to see what day he can get off to come in and call back to schedule cysto in [place].

## 2020-06-13 NOTE — Telephone Encounter (Signed)
-----   Message from Sondra Come, MD sent at 06/12/2020  5:00 PM EDT ----- No infection on urine culture.  I am concerned he has a bladder stone based on the x-ray results.  I think with his history we should do a cystoscopy in clinic first prior to going to the operating room.  Please schedule for early next week in [place], thanks  Legrand Rams, MD 06/12/2020

## 2020-06-16 ENCOUNTER — Other Ambulatory Visit: Payer: Self-pay

## 2020-06-16 ENCOUNTER — Ambulatory Visit (INDEPENDENT_AMBULATORY_CARE_PROVIDER_SITE_OTHER): Payer: No Typology Code available for payment source | Admitting: Urology

## 2020-06-16 ENCOUNTER — Encounter: Payer: Self-pay | Admitting: Urology

## 2020-06-16 VITALS — BP 145/85 | HR 99 | Ht 68.0 in | Wt 210.0 lb

## 2020-06-16 DIAGNOSIS — N329 Bladder disorder, unspecified: Secondary | ICD-10-CM | POA: Diagnosis not present

## 2020-06-16 DIAGNOSIS — N210 Calculus in bladder: Secondary | ICD-10-CM | POA: Diagnosis not present

## 2020-06-16 NOTE — Patient Instructions (Signed)
Bladder Stone  A bladder stone is a buildup of crystals made from the proteins and minerals found in urine. These substances build up when urine becomes too concentrated. Urine is concentrated when there is less water and more proteins and minerals in it. Bladder stones usually develop when a person has another medical condition that prevents the bladder from emptying completely. Crystals can form in the small amount of urine that is left in the bladder. Bladder stones that grow large can become painful and may block the flow of urine. What are the causes? This condition may be caused by:  An enlarged prostate, which prevents the bladder from emptying well.  An infection of a part of your urinary system (urinary tract infection, or UTI). This includes the: ? Kidneys. ? Bladder. ? Ureters. These are the tubes that carry urine to your bladder. ? Urethra. This is the tube that drains urine from your bladder.  A weak spot in the bladder that creates a small pouch (bladder diverticulum).  Nerve damage that may interfere with the signals from your brain to your bladder muscles (neurogenic bladder). This can result from conditions such as Parkinson's disease or spinal cord injuries. What increases the risk? This condition is more likely to develop in people who:  Get frequent UTIs.  Have another medical condition that affects the bladder.  Have a history of bladder surgery.  Have a spinal cord injury.  Have an abnormal shape of the bladder (deformity). What are the signs or symptoms? Common symptoms of this condition include:  Pain in the abdomen.  A need to urinate more often.  Difficulty or pain when urinating.  Blood in the urine.  Cloudy urine or urine that is dark in color.  Pain in the penis or testicles in men. Small bladder stones do not always cause symptoms. How is this diagnosed? This condition may be diagnosed based on your symptoms, medical history, and physical  exam. The physical exam will check for tenderness in your abdomen. For men, an exam in the rectum may be done to check the prostate gland.  You may have tests, such as: ? A urine test (urinalysis). ? A urine sample test to check for other infections (culture). ? Blood tests, including tests to look for a certain substance (creatinine). A creatinine level that is higher than normal could indicate a blockage. ? A procedure to check your bladder using a scope with a camera (cystoscopy).  You may also have imaging studies, such as: ? CT scan or ultrasound of your abdomen and the area between your hip bones (pelvis or pelvic area). ? An X-ray of your urinary system. How is this treated? This condition may be treated with:  Cystolitholapaxy. This procedure uses a laser, ultrasound, or other device to break the stone into smaller pieces. Fluids are used to flush the small pieces from the area.  Surgery to remove the stone.  A stent. This is a small mesh tube that is threaded into your ureter to make urine flow.  Medicines to treat pain. Follow these instructions at home: Medicines  Take over-the-counter and prescription medicines only as told by your health care provider.  Ask your health care provider if the medicine prescribed to you: ? Requires you to avoid driving or using heavy machinery. ? Can cause constipation. You may need to take these actions to prevent or treat constipation:  Take over-the-counter or prescription medicines.  Eat foods that are high in fiber, such as beans, whole  grains, and fresh fruits and vegetables.  Limit foods that are high in fat and processed sugars, such as fried or sweet foods. Alcohol use  Do not drink alcohol if: ? Your health care provider tells you not to drink. ? You are pregnant, may be pregnant, or are planning to become pregnant.  If you drink alcohol: ? Limit how much you drink to:  0-1 drink a day for women.  0-2 drinks a day for  men. ? Be aware of how much alcohol is in your drink. In the [place] one drink equals one 12 oz bottle of beer (355 mL), one 5 oz glass of wine (148 mL), or one 1 oz glass of hard liquor (44 mL). Activity  Rest as told by your health care provider.  Return to your normal activities as told by your health care provider. Ask your health care provider what activities are safe for you. General instructions  Drink enough fluid to keep your urine pale yellow.  Tell your health care provider about any unusual symptoms related to urinating. Early diagnosis of an enlarged prostate and other bladder conditions may reduce your risk of getting bladder stones.  Do not use any products that contain nicotine or tobacco, such as cigarettes, e-cigarettes, or chewing tobacco. If you need help quitting, ask your health care provider.  Do not use drugs.   Where to find more information [facility]): www.urologyhealth.org Contact a health care provider if you:  Have a fever.  Feel nauseous or vomit.  Are unable to urinate.  Have a large amount of blood in your urine. Get help right away if you:  Have severe back pain or pain in the lower part of your abdomen.  Cannot eat or drink without vomiting.  Vomit after taking your medicine. Summary  A bladder stone is a buildup of crystals made from the proteins and minerals found in urine. These substances build up when urine becomes too concentrated.  Bladder stones that grow large can become painful and may block the flow of urine.  Bladder stones may be treated with a laser, a stent, surgery, or pain medicines. This information is not intended to replace advice given to you by your health care provider. Make sure you discuss any questions you have with your health care provider. Document Revised: 08/17/2018 Document Reviewed: 08/17/2018 Elsevier Patient Education  2021 Elsevier Inc.   Bladder Biopsy A bladder biopsy is a  procedure to remove a small sample of tissue from the bladder. The procedure is done so that the tissue can be examined under a microscope. You may have a bladder biopsy to diagnose or rule out cancer of the bladder. During a bladder biopsy, your health care provider may insert a long, thin scope with a lighted camera (cystoscope) into the urethra and move it into your bladder. The cystoscope will allow your health care provider to check the lining of the urethra and bladder and remove the tissue sample. If your health care provider also needs to check your ureters, a longer tube (ureteroscope) may be used. Tell your health care provider about:  Any allergies you have.  All medicines you are taking, including vitamins, herbs, eye drops, creams, and over-the-counter medicines.  Any problems you or family members have had with anesthetic medicines.  Any blood disorders you have.  Any surgeries you have had.  Any medical conditions you have.  Whether you are pregnant or may be pregnant. What are the risks? Generally, this  is a safe procedure. However, problems may occur, including:  Bleeding.  Infection, especially a urinary tract infection (UTI).  Allergic reactions to medicines.  Damage to nearby structures or organs, including the urethra, bladder, or ureters.  Abdominal pain.  Burning or pain during urination.  Narrowing of the urethra due to scar tissue.  Difficulty urinating due to swelling. What happens before the procedure? Medicines Ask your health care provider about:  Changing or stopping your regular medicines. This is especially important if you are taking diabetes medicines or blood thinners.  Taking medicines such as aspirin and ibuprofen. These medicines can thin your blood. Do not take these medicines unless your health care provider tells you to take them.  Taking over-the-counter medicines, vitamins, herbs, and supplements. Surgery safety Ask your health  care provider:  How your surgery site will be marked.  What steps will be taken to help prevent infection. These may include: ? Removing hair at the surgery site. ? Washing skin with a germ-killing soap. ? Receiving antibiotic medicine. General instructions  Follow instructions from your health care provider about eating and drinking restrictions.  You may be asked to drink plenty of fluids.  You may be asked to urinate right before the procedure. You may have a urine sample taken for UTI testing.  Plan to have someone take you home from the hospital or clinic.  If you will be going home right after the procedure, plan to have someone with you for 24 hours. What happens during the procedure?  An IV may be inserted into one of your veins.  You may be given one or more of the following: ? A medicine to help you relax (sedative). ? A medicine to numb the opening of the urethra (local anesthetic). ? A medicine to make you fall asleep (general anesthetic).  You will lie on your back with your knees bent and spread apart.  The cystoscope or ureteroscope will be inserted into your urethra and guided into your bladder or ureters.  Your bladder may be slowly filled with germ-free (sterile) water. This will make it easier for your health care provider to view the wall or lining of your bladder.  Small instruments will be inserted through the scope to collect a small tissue sample that will be examined under a microscope. The procedure may vary among health care providers and hospitals.   What happens after the procedure?  Your blood pressure, heart rate, breathing rate, and blood oxygen level will be monitored until you leave the hospital or clinic.  You may be asked to empty your bladder, or your bladder may be emptied for you.  Do not drive for 24 hours if you received a sedative. Summary  A bladder biopsy is a procedure to remove a small sample of tissue from the bladder.  You  may have a bladder biopsy to diagnose or rule out cancer of the bladder.  Follow instructions from your health care provider about eating and drinking restrictions.  Ask your health care provider if you need to stop or change any medicines that you are taking.  If you will be going home right after the procedure, plan to have someone with you for 24 hours. This information is not intended to replace advice given to you by your health care provider. Make sure you discuss any questions you have with your health care provider. Document Revised: 08/02/2018 Document Reviewed: 08/02/2018 Elsevier Patient Education  2021 [employer].

## 2020-06-16 NOTE — Progress Notes (Signed)
Cystoscopy Procedure Note:  Indication: Bladder stone, urinary symptoms  After informed consent and discussion of the procedure and its risks, Thomas [name] was positioned and prepped in the standard fashion. Cystoscopy was performed with a flexible cystoscope. The urethra, bladder neck and entire bladder was visualized in a standard fashion. The prostate was moderate in size with a high and tight bladder neck and some shaggy mucosa around the bladder neck.  The bladder mucosa was otherwise normal throughout.  Retroflexion confirmed the shaggy mucosa at the bladder neck, as well as a 1 cm yellow crystalline stone at the base of the bladder.  Findings: 1cm bladder stone, irregular bladder neck with shaggy mucosa -------------------------------------------------------  Assessment and Plan: Otherwise healthy 40 year old male with a bladder stone for at least 6 months, as well as a tight bladder neck with irregular shaggy mucosa and a 1 cm bladder stone on cystoscopy.  We reviewed possible etiologies at length, as well as the need for cystolitholapaxy for stone removal, and biopsy of this irregular tissue at the bladder neck.  This may just be secondary to irritation from the stone, but cannot rule out malignancy without tissue biopsy.  Risks and benefits discussed extensively.  We discussed possible need for temporary catheter placement.  Schedule cystoscopy, bladder neck biopsy, cystolitholapaxy  Legrand Rams, MD 06/16/2020

## 2020-06-17 ENCOUNTER — Other Ambulatory Visit: Payer: Self-pay

## 2020-06-17 ENCOUNTER — Telehealth: Payer: Self-pay

## 2020-06-17 DIAGNOSIS — N210 Calculus in bladder: Secondary | ICD-10-CM

## 2020-06-17 DIAGNOSIS — N329 Bladder disorder, unspecified: Secondary | ICD-10-CM

## 2020-06-17 LAB — MISC LABCORP TEST (SEND OUT): Labcorp test code: 86884

## 2020-06-17 NOTE — Telephone Encounter (Signed)
Schedule cystolitholapaxy, bladder biopsy with fulguration, orders placed, open case(blank order entered into the depo unable to enter in description) entered in IQUEUE for 06-20-20 @13 :00

## 2020-06-18 ENCOUNTER — Other Ambulatory Visit: Payer: Self-pay

## 2020-06-18 ENCOUNTER — Encounter
Admission: RE | Admit: 2020-06-18 | Discharge: 2020-06-18 | Disposition: A | Discharge status: Home / Self Care | Payer: No Typology Code available for payment source | Source: Ambulatory Visit | Attending: Urology | Admitting: Urology

## 2020-06-18 NOTE — Progress Notes (Signed)
Perioperative Services Pre-Admission/Anesthesia Testing   Date: 06/18/20 Name: Colburn Asper MRN:   [id number]  Re: Consideration of preoperative prophylactic antibiotic change   Request sent to: Sondra Come, MD (routed and/or faxed via [facility])  Planned Surgical Procedure(s):    Case: [id number] Date/Time: 06/20/20 1333   Procedures:      CYSTOSCOPY WITH LITHOLAPAXY (N/A )     TRANSURETHRAL RESECTION OF BLADDER TUMOR (TURBT) (N/A )   Anesthesia type: Choice   Pre-op diagnosis:      bladder stone     bladder lesion   Location: [facility] OR ROOM 10 / [facility]   Surgeons: Sondra Come, MD    Notes: 1. Patient has a documented allergy to PCN  . Advising that PCN has caused him to experience low severity rash in the past.   2. Screened as appropriate for cephalosporin use during medication reconciliation . No immediate angioedema, dysphagia, SOB, anaphylaxis symptoms. . No severe rash involving mucous membranes or skin necrosis. . No hospital admissions related to side effects of PCN/cephalosporin use.  . No documented reaction to PCN or cephalosporin in the last 10 years.  Request:  As an evidence based approach to reducing the rate of incidence for post-operative SSI and the development of MDROs, could an agent with narrower coverage for preoperative prophylaxis in this patient's upcoming surgical course be considered?   1. Currently ordered preoperative prophylactic ABX: ciprofloxacin.   2. Specifically requesting change to cephalosporin (CEFAZOLIN).   3. Please communicate decision with me and I will change the orders in Epic as per your direction.   Things to consider:  Many patients report that they were "allergic" to PCN earlier in life, however this does not translate into a true lifelong allergy. Patients can lose sensitivity to specific IgE antibodies over time if PCN is avoided (Kleris & Lugar, 2019).   Up to 10% of the adult population and  15% of hospitalized patients report an allergy to PCN, however clinical studies suggest that 90% of those reporting an allergy can tolerate PCN antibiotics (Kleris & Lugar, 2019).   Cross-sensitivity between PCN and cephalosporins has been documented as being as high as 10%, however this estimation included data believed to have been collected in a setting where there was contamination. Newer data suggests that the prevalence of cross-sensitivity between PCN and cephalosporins is actually estimated to be closer to 1% (Hermanides et al., 2018).    Patients labeled as PCN allergic, whether they are truly allergic or not, have been found to have inferior outcomes in terms of rates of serious infection, and these patients tend to have longer hospital stays [facility] & Lugar, 2019).   Treatment related secondary infections, such as Clostridioides difficile, have been linked to the improper use of broad spectrum antibiotics in patients improperly labeled as PCN allergic (Kleris & Lugar, 2019).   Anaphylaxis from cephalosporins is rare and the evidence suggests that there is no increased risk of an anaphylactic type reaction when cephalosporins are used in a PCN allergic patient (Pichichero, 2006).  Citations: Hermanides J, Lemkes BA, Prins Gwenyth Bender MW, Terreehorst I. Presumed ?-Lactam Allergy and Cross-reactivity in the Operating Theater: A Practical Approach. Anesthesiology. 2018 Aug;129(2):335-342. doi: 10.1097/ALN.0000000000002252. PMID: 93716967.  Kleris, R. S., & Lugar, P. L. (2019). Things We Do For No Reason: Failing to Question a Penicillin Allergy History. Journal of hospital medicine, 14(10), [phone]. Advance online publication. airportbarriers.com  Pichichero, M. E. (2006). Cephalosporins can be prescribed safely for penicillin-allergic  patients. Journal of family medicine, 55(2), 106-112. Accessed:  https://cdn.mdedge.com/files/s1fs-public/Document/September-2017/5502JFP_AppliedEvidence1.pdf   Quentin Mulling, MSN, APRN, FNP-C, CEN [facility]  Peri-operative Services Nurse Practitioner FAX: [phone] 06/18/20 2:27 PM

## 2020-06-18 NOTE — Patient Instructions (Addendum)
Your procedure is scheduled on: Jun 20, 2020 FRIDAY Report to the Registration Desk on the 1st floor of the [facility]. To find out your arrival time, please call [phone] between 1PM - 3PM on: Jun 19, 2020 THURSDAY  REMEMBER: Instructions that are not followed completely may result in serious medical risk, up to and including death; or upon the discretion of your surgeon and anesthesiologist your surgery may need to be rescheduled.  Do not eat food OR DRINK after midnight the night before surgery.  No gum chewing, lozengers or hard candies.  TAKE THESE MEDICATIONS THE MORNING OF SURGERY WITH A SIP OF WATER: NONE   One week prior to surgery: Stop Anti-inflammatories (NSAIDS) such as Advil, Aleve, Ibuprofen, Motrin, Naproxen, Naprosyn and ASPIRIN OR  Aspirin based products such as Excedrin, Goodys Powder, BC Powder. Stop ANY OVER THE COUNTER supplements until after surgery.  No Alcohol for 24 hours before or after surgery.  No Smoking including e-cigarettes for 24 hours prior to surgery.  No chewable tobacco products for at least 6 hours prior to surgery.  No nicotine patches on the day of surgery.  Do not use any "recreational" drugs for at least a week prior to your surgery.  Please be advised that the combination of cocaine and anesthesia may have negative outcomes, up to and including death. If you test positive for cocaine, your surgery will be cancelled.  On the morning of surgery brush your teeth with toothpaste and water, you may rinse your mouth with mouthwash if you wish. Do not swallow any toothpaste or mouthwash.  Do not wear jewelry, make-up, hairpins, clips or nail polish.  Do not wear lotions, powders, or perfumes OR DEODORANT  Do not shave body from the neck down 48 hours prior to surgery just in case you cut yourself which could leave a site for infection.  Also, freshly shaved skin may become irritated if using the CHG soap.  Contact lenses, hearing aids  and dentures may not be worn into surgery.  Do not bring valuables to the hospital. [facility] is not responsible for any missing/lost belongings or valuables.   SHOWER MORNING OF SURGERY  Notify your doctor if there is any change in your medical condition (cold, fever, infection).  Wear comfortable clothing (specific to your surgery type) to the hospital.  Plan for stool softeners for home use; pain medications have a tendency to cause constipation. You can also help prevent constipation by eating foods high in fiber such as fruits and vegetables and drinking plenty of fluids as your diet allows.  After surgery, you can help prevent lung complications by doing breathing exercises.  Take deep breaths and cough every 1-2 hours. Your doctor may order a device called an Incentive Spirometer to help you take deep breaths. When coughing or sneezing, hold a pillow firmly against your incision with both hands. This is called "splinting." Doing this helps protect your incision. It also decreases belly discomfort.  If you are being discharged the day of surgery, you will not be allowed to drive home. You will need a responsible adult (18 years or older) to drive you home and stay with you that night.   Please call the Pre-admissions Testing Dept. at [phone] if you have any questions about these instructions.  Surgery Visitation Policy:  Patients undergoing a surgery or procedure may have one family member or support person with them as long as that person is not COVID-19 positive or experiencing its symptoms.  That person may remain in the waiting area during the procedure.  Inpatient Visitation:    Visiting hours are 7 a.m. to 8 p.m. Inpatients will be allowed two visitors daily. The visitors may change each day during the patient's stay. No visitors under the age of 24. Any visitor under the age of 48 must be accompanied by an adult. The visitor must pass COVID-19 screenings, use  hand sanitizer when entering and exiting the patient's room and wear a mask at all times, including in the patient's room. Patients must also wear a mask when staff or their visitor are in the room. Masking is required regardless of vaccination status.

## 2020-06-20 ENCOUNTER — Ambulatory Visit: Payer: No Typology Code available for payment source | Admitting: Anesthesiology

## 2020-06-20 ENCOUNTER — Other Ambulatory Visit: Payer: Self-pay

## 2020-06-20 ENCOUNTER — Encounter: Payer: Self-pay | Admitting: Urology

## 2020-06-20 ENCOUNTER — Ambulatory Visit
Admission: RE | Admit: 2020-06-20 | Discharge: 2020-06-20 | Disposition: A | Discharge status: Home / Self Care | Payer: No Typology Code available for payment source | Attending: Urology | Admitting: Urology

## 2020-06-20 ENCOUNTER — Ambulatory Visit: Payer: No Typology Code available for payment source

## 2020-06-20 ENCOUNTER — Encounter
Admission: RE | Disposition: A | Discharge status: Home / Self Care | Payer: Self-pay | Source: Home / Self Care | Attending: Urology

## 2020-06-20 DIAGNOSIS — Z87442 Personal history of urinary calculi: Secondary | ICD-10-CM | POA: Diagnosis not present

## 2020-06-20 DIAGNOSIS — N3090 Cystitis, unspecified without hematuria: Secondary | ICD-10-CM | POA: Insufficient documentation

## 2020-06-20 DIAGNOSIS — N210 Calculus in bladder: Secondary | ICD-10-CM | POA: Diagnosis present

## 2020-06-20 DIAGNOSIS — N3289 Other specified disorders of bladder: Secondary | ICD-10-CM | POA: Insufficient documentation

## 2020-06-20 DIAGNOSIS — R3915 Urgency of urination: Secondary | ICD-10-CM | POA: Diagnosis not present

## 2020-06-20 DIAGNOSIS — F17200 Nicotine dependence, unspecified, uncomplicated: Secondary | ICD-10-CM | POA: Diagnosis not present

## 2020-06-20 DIAGNOSIS — Z803 Family history of malignant neoplasm of breast: Secondary | ICD-10-CM | POA: Insufficient documentation

## 2020-06-20 DIAGNOSIS — Z833 Family history of diabetes mellitus: Secondary | ICD-10-CM | POA: Insufficient documentation

## 2020-06-20 DIAGNOSIS — N342 Other urethritis: Secondary | ICD-10-CM | POA: Diagnosis not present

## 2020-06-20 DIAGNOSIS — N4289 Other specified disorders of prostate: Secondary | ICD-10-CM | POA: Diagnosis not present

## 2020-06-20 DIAGNOSIS — R300 Dysuria: Secondary | ICD-10-CM | POA: Insufficient documentation

## 2020-06-20 DIAGNOSIS — Z825 Family history of asthma and other chronic lower respiratory diseases: Secondary | ICD-10-CM | POA: Insufficient documentation

## 2020-06-20 DIAGNOSIS — R350 Frequency of micturition: Secondary | ICD-10-CM | POA: Insufficient documentation

## 2020-06-20 DIAGNOSIS — Z8249 Family history of ischemic heart disease and other diseases of the circulatory system: Secondary | ICD-10-CM | POA: Insufficient documentation

## 2020-06-20 DIAGNOSIS — N329 Bladder disorder, unspecified: Secondary | ICD-10-CM

## 2020-06-20 HISTORY — PX: TRANSURETHRAL RESECTION OF BLADDER TUMOR: SHX2575

## 2020-06-20 HISTORY — PX: CYSTOSCOPY WITH LITHOLAPAXY: SHX1425

## 2020-06-20 LAB — CBC
HCT: 47.9 % (ref 39.0–52.0)
Hemoglobin: 16.4 g/dL (ref 13.0–17.0)
MCH: 28.7 pg (ref 26.0–34.0)
MCHC: 34.2 g/dL (ref 30.0–36.0)
MCV: 83.9 fL (ref 80.0–100.0)
Platelets: 335 10*3/uL (ref 150–400)
RBC: 5.71 MIL/uL (ref 4.22–5.81)
RDW: 12.9 % (ref 11.5–15.5)
WBC: 7.8 10*3/uL (ref 4.0–10.5)
nRBC: 0 % (ref 0.0–0.2)

## 2020-06-20 LAB — BASIC METABOLIC PANEL
Anion gap: 11 (ref 5–15)
BUN: 17 mg/dL (ref 6–20)
CO2: 22 mmol/L (ref 22–32)
Calcium: 9.5 mg/dL (ref 8.9–10.3)
Chloride: 105 mmol/L (ref 98–111)
Creatinine, Ser: 0.99 mg/dL (ref 0.61–1.24)
GFR, Estimated: >60 mL/min (ref >60)
Glucose, Bld: 112 mg/dL — ABNORMAL HIGH (ref 70–99)
Potassium: 3.9 mmol/L (ref 3.5–5.1)
Sodium: 138 mmol/L (ref 135–145)

## 2020-06-20 SURGERY — CYSTOSCOPY, WITH BLADDER CALCULUS LITHOLAPAXY
Anesthesia: General

## 2020-06-20 MED ORDER — LACTATED RINGERS IV SOLN: INTRAVENOUS | Status: DC

## 2020-06-20 MED ORDER — FENTANYL CITRATE (PF) 100 MCG/2ML IJ SOLN: 25.0000 ug | INTRAMUSCULAR | Status: DC | PRN

## 2020-06-20 MED ORDER — MEPERIDINE HCL 25 MG/ML IJ SOLN: 6.2500 mg | INTRAMUSCULAR | Status: DC | PRN

## 2020-06-20 MED ORDER — ACETAMINOPHEN 10 MG/ML IV SOLN
INTRAVENOUS | Status: AC
  Filled 2020-06-20: qty 100

## 2020-06-20 MED ORDER — ONDANSETRON HCL 4 MG/2ML IJ SOLN: 4.0000 mg | Freq: Once | INTRAMUSCULAR | Status: DC | PRN

## 2020-06-20 MED ORDER — DEXMEDETOMIDINE (PRECEDEX) IN NS 20 MCG/5ML (4 MCG/ML) IV SYRINGE
PREFILLED_SYRINGE | INTRAVENOUS | Status: DC | PRN
  Administered 2020-06-20: 20 ug via INTRAVENOUS
  Administered 2020-06-20: 8 ug via INTRAVENOUS
  Administered 2020-06-20: 12 ug via INTRAVENOUS

## 2020-06-20 MED ORDER — ORAL CARE MOUTH RINSE: 15.0000 mL | Freq: Once | OROMUCOSAL | Status: AC

## 2020-06-20 MED ORDER — FAMOTIDINE 20 MG PO TABS
ORAL_TABLET | ORAL | Status: AC
  Administered 2020-06-20: 20 mg via ORAL
  Filled 2020-06-20: qty 1

## 2020-06-20 MED ORDER — BELLADONNA ALKALOIDS-OPIUM 16.2-60 MG RE SUPP
RECTAL | Status: DC | PRN
  Administered 2020-06-20: 1 via RECTAL

## 2020-06-20 MED ORDER — PROPOFOL 10 MG/ML IV BOLUS
INTRAVENOUS | Status: DC | PRN
  Administered 2020-06-20: 50 mg via INTRAVENOUS
  Administered 2020-06-20: 200 mg via INTRAVENOUS

## 2020-06-20 MED ORDER — ONDANSETRON HCL 4 MG/2ML IJ SOLN
INTRAMUSCULAR | Status: DC | PRN
  Administered 2020-06-20: 4 mg via INTRAVENOUS

## 2020-06-20 MED ORDER — SUCCINYLCHOLINE CHLORIDE 20 MG/ML IJ SOLN
INTRAMUSCULAR | Status: DC | PRN
  Administered 2020-06-20: 120 mg via INTRAVENOUS

## 2020-06-20 MED ORDER — LIDOCAINE HCL (CARDIAC) PF 100 MG/5ML IV SOSY
PREFILLED_SYRINGE | INTRAVENOUS | Status: DC | PRN
  Administered 2020-06-20: 100 mg via INTRAVENOUS

## 2020-06-20 MED ORDER — MIDAZOLAM HCL 2 MG/2ML IJ SOLN
INTRAMUSCULAR | Status: DC | PRN
  Administered 2020-06-20: 2 mg via INTRAVENOUS

## 2020-06-20 MED ORDER — HYDROCODONE-ACETAMINOPHEN 5-325 MG PO TABS: 1.0000 | ORAL_TABLET | Freq: Four times a day (QID) | ORAL | 0 refills | Status: AC | PRN

## 2020-06-20 MED ORDER — ACETAMINOPHEN 10 MG/ML IV SOLN
INTRAVENOUS | Status: DC | PRN
  Administered 2020-06-20: 1000 mg via INTRAVENOUS

## 2020-06-20 MED ORDER — BELLADONNA ALKALOIDS-OPIUM 16.2-60 MG RE SUPP
RECTAL | Status: AC
  Filled 2020-06-20: qty 1

## 2020-06-20 MED ORDER — CIPROFLOXACIN IN D5W 400 MG/200ML IV SOLN
400.0000 mg | INTRAVENOUS | Status: AC
  Administered 2020-06-20: 400 mg via INTRAVENOUS

## 2020-06-20 MED ORDER — ROCURONIUM BROMIDE 100 MG/10ML IV SOLN
INTRAVENOUS | Status: DC | PRN
  Administered 2020-06-20: 40 mg via INTRAVENOUS
  Administered 2020-06-20: 10 mg via INTRAVENOUS

## 2020-06-20 MED ORDER — MIDAZOLAM HCL 2 MG/2ML IJ SOLN
INTRAMUSCULAR | Status: AC
  Filled 2020-06-20: qty 2

## 2020-06-20 MED ORDER — FENTANYL CITRATE (PF) 100 MCG/2ML IJ SOLN
INTRAMUSCULAR | Status: DC | PRN
  Administered 2020-06-20 (×2): 50 ug via INTRAVENOUS

## 2020-06-20 MED ORDER — DEXAMETHASONE SODIUM PHOSPHATE 10 MG/ML IJ SOLN
INTRAMUSCULAR | Status: DC | PRN
  Administered 2020-06-20: 10 mg via INTRAVENOUS

## 2020-06-20 MED ORDER — CHLORHEXIDINE GLUCONATE 0.12 % MT SOLN
OROMUCOSAL | Status: AC
  Administered 2020-06-20: 15 mL via OROMUCOSAL
  Filled 2020-06-20: qty 15

## 2020-06-20 MED ORDER — GLYCOPYRROLATE 0.2 MG/ML IJ SOLN
INTRAMUSCULAR | Status: DC | PRN
  Administered 2020-06-20: .2 mg via INTRAVENOUS

## 2020-06-20 MED ORDER — FAMOTIDINE 20 MG PO TABS: 20.0000 mg | ORAL_TABLET | Freq: Once | ORAL | Status: AC

## 2020-06-20 MED ORDER — CIPROFLOXACIN IN D5W 400 MG/200ML IV SOLN
INTRAVENOUS | Status: AC
  Filled 2020-06-20: qty 200

## 2020-06-20 MED ORDER — FENTANYL CITRATE (PF) 100 MCG/2ML IJ SOLN
INTRAMUSCULAR | Status: AC
  Filled 2020-06-20: qty 2

## 2020-06-20 MED ORDER — CHLORHEXIDINE GLUCONATE 0.12 % MT SOLN: 15.0000 mL | Freq: Once | OROMUCOSAL | Status: AC

## 2020-06-20 SURGICAL SUPPLY — 38 items
BAG DRAIN CYSTO-URO LG1000N (MISCELLANEOUS) IMPLANT
BAG DRN RND TRDRP ANRFLXCHMBR (UROLOGICAL SUPPLIES) ×1
BAG URINE DRAIN 2000ML AR STRL (UROLOGICAL SUPPLIES) ×2 IMPLANT
BRUSH SCRUB EZ  4% CHG (MISCELLANEOUS) ×1
BRUSH SCRUB EZ 4% CHG (MISCELLANEOUS) ×1 IMPLANT
CATH FOL 2WAY LX 18X30 (CATHETERS) ×2 IMPLANT
CATH FOL 2WAY LX 20X30 (CATHETERS) ×2 IMPLANT
CNTNR SPEC 2.5X3XGRAD LEK (MISCELLANEOUS)
CONT SPEC 4OZ STER OR WHT (MISCELLANEOUS)
CONT SPEC 4OZ STRL OR WHT (MISCELLANEOUS)
CONTAINER SPEC 2.5X3XGRAD LEK (MISCELLANEOUS) IMPLANT
DRAPE UTILITY 15X26 TOWEL STRL (DRAPES) ×2 IMPLANT
DRSG TELFA 4X3 1S NADH ST (GAUZE/BANDAGES/DRESSINGS) ×2 IMPLANT
ELECT LOOP 22F BIPOLAR SML (ELECTROSURGICAL)
ELECT REM PT RETURN 9FT ADLT (ELECTROSURGICAL)
ELECTRODE LOOP 22F BIPOLAR SML (ELECTROSURGICAL) IMPLANT
ELECTRODE REM PT RTRN 9FT ADLT (ELECTROSURGICAL) IMPLANT
FIBER LASER FLEXIVA PULSE 550 (Laser) IMPLANT
GLOVE SURG UNDER POLY LF SZ7.5 (GLOVE) ×2 IMPLANT
GOWN STRL REUS W/ TWL LRG LVL3 (GOWN DISPOSABLE) ×1 IMPLANT
GOWN STRL REUS W/ TWL XL LVL3 (GOWN DISPOSABLE) ×1 IMPLANT
GOWN STRL REUS W/TWL LRG LVL3 (GOWN DISPOSABLE) ×2
GOWN STRL REUS W/TWL XL LVL3 (GOWN DISPOSABLE) ×2
HOLDER FOLEY CATH W/STRAP (MISCELLANEOUS) ×2 IMPLANT
IV NS IRRIG 3000ML ARTHROMATIC (IV SOLUTION) ×4 IMPLANT
KIT PROBE TRILOGY 3.9X350 (MISCELLANEOUS) IMPLANT
KIT TURNOVER CYSTO (KITS) ×2 IMPLANT
LOOP CUT BIPOLAR 24F LRG (ELECTROSURGICAL) ×2 IMPLANT
NDL SAFETY ECLIPSE 18X1.5 (NEEDLE) ×1 IMPLANT
NEEDLE HYPO 18GX1.5 SHARP (NEEDLE) ×2
PACK CYSTO AR (MISCELLANEOUS) ×2 IMPLANT
PAD ARMBOARD 7.5X6 YLW CONV (MISCELLANEOUS) ×2 IMPLANT
SET CYSTO W/LG BORE CLAMP LF (SET/KITS/TRAYS/PACK) ×2 IMPLANT
SET IRRIG Y TYPE TUR BLADDER L (SET/KITS/TRAYS/PACK) ×2 IMPLANT
SURGILUBE 2OZ TUBE FLIPTOP (MISCELLANEOUS) ×2 IMPLANT
SYR TOOMEY IRRIG 70ML (MISCELLANEOUS) ×2
SYRINGE TOOMEY IRRIG 70ML (MISCELLANEOUS) ×1 IMPLANT
WATER STERILE IRR 1000ML POUR (IV SOLUTION) ×2 IMPLANT

## 2020-06-20 NOTE — Interval H&P Note (Signed)
UROLOGY H&P UPDATE  Agree with prior H&P dated 07/07/2020.  1 cm bladder stone, prior CT with no hydronephrosis or ureteral stones, significant inflammation of bladder neck.  Suspect he passed a kidney stone and this never passed out of the bladder forming a larger bladder stone with irritation of the bladder neck and prostate.  We discussed possible need for prostate biopsy with transurethral resection.  We also discussed possible outlet obstruction, but that is less consistent with his history, and possible need for an outlet procedure in the future.  Cardiac: RRR Lungs: CTA bilaterally  Laterality: N/A Procedure: Cystolitholapaxy and bladder/prostate biopsy/transurethral resection  Urine: Culture 5/3 no growth  Informed consent obtained, we specifically discussed the risks of bleeding, infection, post-operative pain, possible temporary catheter placement, need for additional procedures.  Sondra Come, MD 06/20/2020

## 2020-06-20 NOTE — Transfer of Care (Signed)
Immediate Anesthesia Transfer of Care Note  Patient: Thomas [name]  Procedure(s) Performed: CYSTOSCOPY WITH LITHOLAPAXY (N/A ) TRANSURETHRAL RESECTION OF PROSTATE (N/A )  Patient Location: PACU  Anesthesia Type:General  Level of Consciousness: drowsy and responds to stimulation  Airway & Oxygen Therapy: Patient Spontanous Breathing and Patient connected to face mask oxygen  Post-op Assessment: Report given to RN and Post -op Vital signs reviewed and stable  Post vital signs: Reviewed and stable  Last Vitals:  Vitals Value Taken Time  BP 95/65 06/20/20 1315  Temp 36.8 C 06/20/20 1309  Pulse 73 06/20/20 1319  Resp 9 06/20/20 1319  SpO2 97 % 06/20/20 1319  Vitals shown include unvalidated device data.  Last Pain:  Vitals:   06/20/20 1309  TempSrc:   PainSc: Asleep         Complications: No complications documented.

## 2020-06-20 NOTE — Discharge Instructions (Signed)
[facility] PERIOPERATIVE AREA [address] [place], [place]  [address] Phone:  [phone]   Patient: Thomas [name]  Date of Birth: Jul 05, 1980  Date of Visit: 06/17/2020   To Whom It May Concern:  Thomas [name] had a surgical procedure and treated on 06/17/2020 and    will return to work 06/27/20..           Sincerely,     Treatment Team:  Attending Provider: Sondra Come, MD                                    Transurethral Resection of the Prostate, Care After This sheet gives you information about how to care for yourself after your procedure. Your health care provider may also give you more specific instructions. If you have problems or questions, contact your health care provider. What can I expect after the procedure? After the procedure, it is common to have:  Mild pain in your lower abdomen.  Soreness or mild discomfort in your penis from having the catheter inserted during the procedure.  A feeling of urgency when you need to urinate.  A small amount of blood in your urine. You may notice some small blood clots in your urine. These are normal. Follow these instructions at home: Medicines  Take over-the-counter and prescription medicines only as told by your health care provider.  If you were prescribed an antibiotic medicine, take it as told by your health care provider. Do not stop taking the antibiotic even if you start to feel better.  Ask your health care provider if the medicine prescribed to you: ? Requires you to avoid driving or using heavy machinery. ? Can cause constipation. You may need to take actions to prevent or treat constipation, such as:  Take over-the-counter or prescription medicines.  Eat foods that are high in fiber, such as fresh fruits and vegetables, whole grains, and beans.  Limit foods that are high in fat and processed sugars, such as fried or sweet foods.  Do  not drive for 24 hours if you were given a sedative during your procedure. Activity  Return to your normal activities as told by your health care provider. Ask your health care provider what activities are safe for you.  Do not lift anything that is heavier than 10 lb (4.5 kg), or the limit that you are told, for 3 weeks after the procedure or until your health care provider says that it is safe.  Avoid intense physical activity for as long as told by your health care provider.  Avoid sitting for a long time without moving. Get up and move around one or more times every few hours. This helps to prevent blood clots. You may increase your physical activity gradually as you start to feel better.   Lifestyle  Do not drink alcohol for as long as told by your health care provider. This is especially important if you are taking prescription pain medicines.  Do not engage in sexual activity until your health care provider says that you can do this. General instructions  Do not take baths, swim, or use a hot tub until your health care provider approves.  Drink enough fluid to keep your urine pale yellow.  Urinate as soon as you feel the need to. Do not try to hold your urine for long periods of time.  If your health care  provider approves, you may take a stool softener for 2-3 weeks to prevent you from straining to have a bowel movement.  Wear compression stockings as told by your health care provider. These stockings help to prevent blood clots and reduce swelling in your legs.  Keep all follow-up visits as told by your health care provider. This is important.   Contact a health care provider if you have:  Difficulty urinating.  A fever.  Pain that gets worse or does not improve with medicine.  Blood in your urine that does not go away after 1 week of resting and drinking more fluids.  Swelling in your penis or testicles. Get help right away if:  You are unable to urinate.  You are  having more blood clots in your urine instead of fewer.  You have: ? Large blood clots. ? A lot of blood in your urine. ? Pain in your back or lower abdomen. ? Pain or swelling in your legs. ? Chills and you are shaking. ? Difficulty breathing or shortness of breath. Summary  After the procedure, it is common to have a small amount of blood in your urine.  Avoid heavy lifting and intense physical activity for as long as told by your health care provider.  Urinate as soon as you feel the need to. Do not try to hold your urine for long periods of time.  Keep all follow-up visits as told by your health care provider. This is important. This information is not intended to replace advice given to you by your health care provider. Make sure you discuss any questions you have with your health care provider. Document Revised: 05/17/2018 Document Reviewed: 10/26/2017 Elsevier Patient Education  2021 Elsevier Inc.    Indwelling Urinary Catheter Care, Adult An indwelling urinary catheter is a thin tube that is put into your bladder. The tube helps to drain pee (urine) out of your body. The tube goes in through your urethra. Your urethra is where pee comes out of your body. Your pee will come out through the catheter, then it will go into a bag (drainage bag). Take good care of your catheter so it will work well. How to wear your catheter and bag Supplies needed  Sticky tape (adhesive tape) or a leg strap.  Alcohol wipe or soap and water (if you use tape).  A clean towel (if you use tape).  Large overnight bag.  Smaller bag (leg bag). Wearing your catheter Attach your catheter to your leg with tape or a leg strap.  Make sure the catheter is not pulled tight.  If a leg strap gets wet, take it off and put on a dry strap.  If you use tape to hold the bag on your leg: 1. Use an alcohol wipe or soap and water to wash your skin where the tape made it sticky before. 2. Use a clean towel  to pat-dry that skin. 3. Use new tape to make the bag stay on your leg. Wearing your bags You should have been given a large overnight bag.  You may wear the overnight bag in the day or night.  Always have the overnight bag lower than your bladder.  Do not let the bag touch the floor.  Before you go to sleep, put a clean plastic bag in a wastebasket. Then hang the overnight bag inside the wastebasket. You should also have a smaller leg bag that fits under your clothes.  Always wear the leg bag below your  knee.  Do not wear your leg bag at night. How to care for your skin and catheter Supplies needed  A clean washcloth.  Water and mild soap.  A clean towel. Caring for your skin and catheter  Clean the skin around your catheter every day: 1. Wash your hands with soap and water. 2. Wet a clean washcloth in warm water and mild soap. 3. Clean the skin around your urethra.  If you are male:  Gently spread the folds of skin around your vagina (labia).  With the washcloth in your other hand, wipe the inner side of your labia on each side. Wipe from front to back.  If you are male:  Pull back any skin that covers the end of your penis (foreskin).  With the washcloth in your other hand, wipe your penis in small circles. Start wiping at the tip of your penis, then move away from the catheter.  Move the foreskin back in place, if needed. 4. With your free hand, hold the catheter close to where it goes into your body.  Keep holding the catheter during cleaning so it does not get pulled out. 5. With the washcloth in your other hand, clean the catheter.  Only wipe downward on the catheter.  Do not wipe upward toward your body. Doing this may push germs into your urethra and cause infection. 6. Use a clean towel to pat-dry the catheter and the skin around it. Make sure to wipe off all soap. 7. Wash your hands with soap and water.  Shower every day. Do not take baths.  Do not  use cream, ointment, or lotion on the area where the catheter goes into your body, unless your doctor tells you to.  Do not use powders, sprays, or lotions on your genital area.  Check your skin around the catheter every day for signs of infection. Check for: ? Redness, swelling, or pain. ? Fluid or blood. ? Warmth. ? Pus or a bad smell.      How to empty the bag Supplies needed  Rubbing alcohol.  Gauze pad or cotton ball.  Tape or a leg strap. Emptying the bag Pour the pee out of your bag when it is ?- full, or at least 2-3 times a day. Do this for your overnight bag and your leg bag. 1. Wash your hands with soap and water. 2. Separate (detach) the bag from your leg. 3. Hold the bag over the toilet or a clean pail. Keep the bag lower than your hips and bladder. This is so the pee (urine) does not go back into the tube. 4. Open the pour spout. It is at the bottom of the bag. 5. Empty the pee into the toilet or pail. Do not let the pour spout touch any surface. 6. Put rubbing alcohol on a gauze pad or cotton ball. 7. Use the gauze pad or cotton ball to clean the pour spout. 8. Close the pour spout. 9. Attach the bag to your leg with tape or a leg strap. 10. Wash your hands with soap and water. Follow instructions for cleaning the drainage bag:  From the product maker.  As told by your doctor. How to change the bag Supplies needed  Alcohol wipes.  A clean bag.  Tape or a leg strap. Changing the bag Replace your bag when it starts to leak, smell bad, or look dirty. 1. Wash your hands with soap and water. 2. Separate the dirty bag from your leg.  3. Pinch the catheter with your fingers so that pee does not spill out. 4. Separate the catheter tube from the bag tube where these tubes connect (at the connection valve). Do not let the tubes touch any surface. 5. Clean the end of the catheter tube with an alcohol wipe. Use a different alcohol wipe to clean the end of the bag  tube. 6. Connect the catheter tube to the tube of the clean bag. 7. Attach the clean bag to your leg with tape or a leg strap. Do not make the bag tight on your leg. 8. Wash your hands with soap and water. General rules  Never pull on your catheter. Never try to take it out. Doing that can hurt you.  Always wash your hands before and after you touch your catheter or bag. Use a mild, fragrance-free soap. If you do not have soap and water, use hand sanitizer.  Always make sure there are no twists or bends (kinks) in the catheter tube.  Always make sure there are no leaks in the catheter or bag.  Drink enough fluid to keep your pee pale yellow.  Do not take baths, swim, or use a hot tub.  If you are male, wipe from front to back after you poop (have a bowel movement).   Contact a doctor if:  Your pee is cloudy.  Your pee smells worse than usual.  Your catheter gets clogged.  Your catheter leaks.  Your bladder feels full. Get help right away if:  You have redness, swelling, or pain where the catheter goes into your body.  You have fluid, blood, pus, or a bad smell coming from the area where the catheter goes into your body.  Your skin feels warm where the catheter goes into your body.  You have a fever.  You have pain in your: ? Belly (abdomen). ? Legs. ? Lower back. ? Bladder.  You see blood in the catheter.  Your pee is pink or red.  You feel sick to your stomach (nauseous).  You throw up (vomit).  You have chills.  Your pee is not draining into the bag.  Your catheter gets pulled out. Summary  An indwelling urinary catheter is a thin tube that is placed into the bladder to help drain pee (urine) out of the body.  The catheter is placed into the part of the body that drains pee from the bladder (urethra).  Taking good care of your catheter will keep it working properly and help prevent problems.  Always wash your hands before and after touching your  catheter or bag.  Never pull on your catheter or try to take it out. This information is not intended to replace advice given to you by your health care provider. Make sure you discuss any questions you have with your health care provider. Document Revised: 05/19/2018 Document Reviewed: 09/10/2016 Elsevier Patient Education  2021 Elsevier Inc.  AMBULATORY SURGERY  DISCHARGE INSTRUCTIONS   1) The drugs that you were given will stay in your system until tomorrow so for the next 24 hours you should not:  A) Drive an automobile B) Make any legal decisions C) Drink any alcoholic beverage   2) You may resume regular meals tomorrow.  Today it is better to start with liquids and gradually work up to solid foods.  You may eat anything you prefer, but it is better to start with liquids, then soup and crackers, and gradually work up to solid foods.  3) Please notify your doctor immediately if you have any unusual bleeding, trouble breathing, redness and pain at the surgery site, drainage, fever, or pain not relieved by medication.    4) Additional Instructions:        Please contact your physician with any problems or Same Day Surgery at [phone], Monday through Friday 6 am to 4 pm, or [place] at [facility] Main number at [phone].

## 2020-06-20 NOTE — Anesthesia Procedure Notes (Signed)
Procedure Name: Intubation Performed by: Fletcher-Harrison, Sriman Tally, CRNA Pre-anesthesia Checklist: Patient identified, Emergency Drugs available, Suction available and Patient being monitored Patient Re-evaluated:Patient Re-evaluated prior to induction Oxygen Delivery Method: Circle system utilized Preoxygenation: Pre-oxygenation with 100% oxygen Induction Type: IV induction Ventilation: Mask ventilation without difficulty Laryngoscope Size: McGraph and 3 Grade View: Grade I Tube type: Oral Number of attempts: 1 Airway Equipment and Method: Stylet and Oral airway Placement Confirmation: ETT inserted through vocal cords under direct vision,  positive ETCO2,  breath sounds checked- equal and bilateral and CO2 detector Secured at: 21 cm Tube secured with: Tape Dental Injury: Teeth and Oropharynx as per pre-operative assessment        

## 2020-06-20 NOTE — Anesthesia Postprocedure Evaluation (Signed)
Anesthesia Post Note  Patient: Thomas [name]  Procedure(s) Performed: CYSTOSCOPY WITH LITHOLAPAXY (N/A ) TRANSURETHRAL RESECTION OF PROSTATE (N/A )  Patient location during evaluation: PACU Anesthesia Type: General Level of consciousness: awake and alert, awake and oriented Pain management: pain level controlled Vital Signs Assessment: post-procedure vital signs reviewed and stable Respiratory status: spontaneous breathing, nonlabored ventilation and respiratory function stable Cardiovascular status: blood pressure returned to baseline and stable Postop Assessment: no apparent nausea or vomiting Anesthetic complications: no   No complications documented.   Last Vitals:  Vitals:   06/20/20 1345 06/20/20 1401  BP: 106/82 (!) 147/72  Pulse: 88   Resp: (!) 21 16  Temp: 36.5 C   SpO2: 95% 97%    Last Pain:  Vitals:   06/20/20 1345  TempSrc:   PainSc: 0-No pain                 Manfred Arch

## 2020-06-20 NOTE — Op Note (Signed)
Date of procedure: 06/20/20  Preoperative diagnosis:  1. Bladder stone 2. Abnormal bladder neck/prostate lesion  Postoperative diagnosis:  1. Same  Procedure: 1. Cystolitholapaxy(1.5 cm bladder stone) 2. Transurethral resection of prostate  Surgeon: Legrand Rams, MD  Anesthesia: General  Complications: None  Intraoperative findings:  1.  Normal-appearing urethra, small prostate with high bladder neck and extensive papillary changes circumferentially within the prostate and bladder neck 2.  No suspicious bladder lesions 3.  Uncomplicated cystolitholapaxy and stone removal 4.  Abnormal tissue within the prostatic fossa and bladder neck resected, excellent hemostasis  EBL: Minimal  Specimens: Prostate chips  Drains: 20 French two-way Foley  Indication: Thomas [name] is a 40 y.o. patient with irritative urinary symptoms and negative urine culture and found to have 1.5 cm bladder stone and significant papillary changes around the prostatic fossa and bladder neck.  After reviewing the management options for treatment, they elected to proceed with the above surgical procedure(s). We have discussed the potential benefits and risks of the procedure, side effects of the proposed treatment, the likelihood of the patient achieving the goals of the procedure, and any potential problems that might occur during the procedure or recuperation. Informed consent has been obtained.  Description of procedure:  The patient was taken to the operating room and general anesthesia was induced. SCDs were placed for DVT prophylaxis. The patient was placed in the dorsal lithotomy position, prepped and draped in the usual sterile fashion, and preoperative antibiotics were administered. A preoperative time-out was performed.   The urethra was serially dilated to 30 [place] using [employer].  A 25 French cystoscope was used to intubate the urethra and a normal-appearing urethra was followed proximally  towards the bladder.  The prostate was small with a high bladder neck, and there was circumferential abnormal papillary changes within the prostatic fossa and bladder neck.  Thorough cystoscopy demonstrated a 1.5 cm yellow bladder stone.  There were no suspicious lesions within the bladder, and mild trabeculations.  The manual stone crusher was inserted through the scope, and the stone was carefully fragmented into small pieces and these were irrigated free from the bladder.  I then changed over to the 26 French resectoscope using the visual obturator.  A large resecting loop was used to resect all abnormal papillary appearing tissue circumferentially in the prostatic fossa and at the bladder neck.  The ureteral orifices remained intact.  Meticulous hemostasis was achieved.  With the bladder decompressed there was no significant bleeding.  A 20 [place] two-way Foley passed easily into the bladder with return of clear urine and irrigated easily.  15 cc were placed in the balloon.  Disposition: Stable to PACU  Plan: Follow-up pathology Remove Foley on Monday morning  Legrand Rams, MD

## 2020-06-20 NOTE — Anesthesia Preprocedure Evaluation (Signed)
Anesthesia Evaluation  Patient identified by MRN, date of birth, ID band Patient awake    Reviewed: Allergy & Precautions, NPO status , Patient's Chart, lab work & pertinent test results  Airway Mallampati: III  TM Distance: >3 FB Neck ROM: Full    Dental  (+) Missing, Poor Dentition   Pulmonary neg pulmonary ROS, Patient abstained from smoking., former smoker,           Cardiovascular negative cardio ROS Normal cardiovascular exam     Neuro/Psych  Headaches, negative psych ROS   GI/Hepatic negative GI ROS, Neg liver ROS,   Endo/Other  negative endocrine ROS  Renal/GU Renal disease  negative genitourinary   Musculoskeletal negative musculoskeletal ROS (+)   Abdominal (+) + obese,   Peds negative pediatric ROS (+)  Hematology negative hematology ROS (+)   Anesthesia Other Findings   Reproductive/Obstetrics negative OB ROS                             Anesthesia Physical Anesthesia Plan  ASA: II  Anesthesia Plan: General   Post-op Pain Management:    Induction: Intravenous  PONV Risk Score and Plan: 2 and Propofol infusion and Ondansetron  Airway Management Planned: Oral ETT and Video Laryngoscope Planned  Additional Equipment:   Intra-op Plan:   Post-operative Plan:   Informed Consent: I have reviewed the patients History and Physical, chart, labs and discussed the procedure including the risks, benefits and alternatives for the proposed anesthesia with the patient or authorized representative who has indicated his/her understanding and acceptance.     Dental advisory given  Plan Discussed with: CRNA, Anesthesiologist and Surgeon  Anesthesia Plan Comments:         Anesthesia Quick Evaluation

## 2020-06-21 ENCOUNTER — Encounter: Payer: Self-pay | Admitting: Urology

## 2020-06-23 ENCOUNTER — Ambulatory Visit (INDEPENDENT_AMBULATORY_CARE_PROVIDER_SITE_OTHER): Payer: No Typology Code available for payment source | Admitting: Physician Assistant

## 2020-06-23 ENCOUNTER — Other Ambulatory Visit: Payer: Self-pay

## 2020-06-23 DIAGNOSIS — N329 Bladder disorder, unspecified: Secondary | ICD-10-CM

## 2020-06-23 DIAGNOSIS — N210 Calculus in bladder: Secondary | ICD-10-CM

## 2020-06-23 LAB — SURGICAL PATHOLOGY

## 2020-06-23 NOTE — Progress Notes (Signed)
Catheter Removal  Patient is present today for a catheter removal.  28ml of water was drained from the balloon. A 20FR foley cath was removed from the bladder no complications were noted. Patient tolerated well.  Performed by: Carman Ching, PA-C   Follow up/ Additional notes: Dr. Richardo Hanks to call with surgical pathology results

## 2020-06-24 ENCOUNTER — Telehealth: Payer: Self-pay

## 2020-06-24 NOTE — Telephone Encounter (Signed)
-----   Message from Sondra Come, MD sent at 06/24/2020  8:10 AM EDT ----- Randie Heinz news, no cancer on prostate specimen from surgery- just inflammation from stone. RTC 6 mo with IPSS/PVR, sooner if problems  Thanks Legrand Rams, MD 06/24/2020

## 2020-06-24 NOTE — Telephone Encounter (Signed)
Called pt no answer, LM for pt per DPR informing him of the information below. Advised pt to call back for questions or concerns. Appt scheduled.

## 2020-06-30 ENCOUNTER — Telehealth: Payer: Self-pay

## 2020-06-30 NOTE — Telephone Encounter (Signed)
Patient called today complaining of post voiding discomfort. He describes a shooting/burning sensation at the end of urination, starting a few days ago. No other symptoms, denies fever chills, dysuria, hematuria. Patient is 10 days post Cystolitholapaxy/ TURP. It was explained that he is most likely having residual bladder spasms and is healing from surgery. He was told to monitor symptoms and if they worsened or if he started to develop new urinary symptoms or fever or chills presented he should call back to rule out possible infection. Patient verbalized understanding.

## 2020-07-03 ENCOUNTER — Telehealth: Payer: Self-pay

## 2020-07-03 NOTE — Telephone Encounter (Signed)
Incoming call from pt triage line who states that he was told to call back in a few days if symptoms had not resolved. Pt states that he is having burning at the end of urinary stream as well as pressure in the bladder. Offered pt appt for tomorrow 07/04/20, pt declines and states that he cannot come in until Tuesday May 31st, Pt scheduled. Pt advised on short term AZO and increased hydration until appt.

## 2020-07-07 NOTE — Progress Notes (Signed)
07/08/2020 4:11 PM   Thomas [name] 1980/03/31 [id number]  Referring provider: No referring provider defined for this encounter.  Chief Complaint  Patient presents with  . Dysuria   Urological history: 1. Bladder stone -s/p Cystolitholapaxy(1.5 cm bladder stone) and transurethral resection of prostate on 06/20/2020 -pathology demonstrated inflammatory tissue, no malignancy   HPI: Thomas [name] is a 40 y.o. male who presents today for pain with urination.  He states the first week after the surgery when the catheter was removed he was fine, but then he started to experience pain after urination associated with urgency.  He states he is voiding with a good strong stream.  Patient denies any modifying or aggravating factors.  Patient denies any gross hematuria or suprapubic/flank pain.  Patient denies any fevers, chills, nausea or vomiting.   UA 11-30 WBC's,  > 30 RBC's and a few bacteria.    PVR 54 mL.    PMH: Past Medical History:  Diagnosis Date  . Reflux   . Renal disorder    kidney stones  . Sinusitis     Surgical History: Past Surgical History:  Procedure Laterality Date  . CYSTOSCOPY WITH LITHOLAPAXY N/A 06/20/2020   Procedure: CYSTOSCOPY WITH LITHOLAPAXY;  Surgeon: Sondra Come, MD;  Location: [facility];  Service: Urology;  Laterality: N/A;  . TRANSURETHRAL RESECTION OF BLADDER TUMOR N/A 06/20/2020   Procedure: TRANSURETHRAL RESECTION OF PROSTATE;  Surgeon: Sondra Come, MD;  Location: [facility];  Service: Urology;  Laterality: N/A;    Home Medications:  Allergies as of 07/08/2020      Reactions   Penicillins Rash      Medication List       Accurate as of Jul 08, 2020 11:59 PM. If you have any questions, ask your nurse or doctor.        sulfamethoxazole-trimethoprim 800-160 MG tablet Commonly known as: BACTRIM DS Take 1 tablet by mouth every 12 (twelve) hours. Started by: Michiel Cowboy, PA-C       Allergies:  Allergies  Allergen  Reactions  . Penicillins Rash    Family History: Family History  Problem Relation Age of Onset  . Breast cancer Mother   . Pneumonia Father   . COPD Sister   . Diabetes Mellitus I Maternal Grandmother   . Diabetes Mellitus I Paternal Grandmother   . Heart disease Paternal Grandfather     Social History:  reports that he quit smoking about 20 months ago. He smoked 0.00 packs per day for 0.00 years. He has never used smokeless tobacco. He reports current alcohol use. He reports that he does not use drugs.  ROS: Pertinent ROS in HPI  Physical Exam: BP 134/86   Pulse 91   Ht 5\' 9"  (1.753 m)   Wt 211 lb (95.7 kg)   BMI 31.16 kg/m   Constitutional:  Well nourished. Alert and oriented, No acute distress. HEENT: [place] AT, mask in place.  Trachea midline Cardiovascular: No clubbing, cyanosis, or edema. Respiratory: Normal respiratory effort, no increased work of breathing. Neurologic: Grossly intact, no focal deficits, moving all 4 extremities. Psychiatric: Normal mood and affect.  Laboratory Data: Lab Results  Component Value Date   WBC 7.8 06/20/2020   HGB 16.4 06/20/2020   HCT 47.9 06/20/2020   MCV 83.9 06/20/2020   PLT 335 06/20/2020    Lab Results  Component Value Date   CREATININE 0.99 06/20/2020   Urinalysis Component     Latest Ref Rng & Units 07/08/2020  Specific  Gravity, UA     1.005 - 1.030 1.025  pH, UA     5.0 - 7.5 7.5  Color, UA     Yellow Yellow  Appearance Ur     Clear Cloudy (A)  Leukocytes,UA     Negative 1+ (A)  Protein,UA     Negative/Trace Negative  Glucose, UA     Negative Negative  Ketones, UA     Negative Negative  RBC, UA     Negative 2+ (A)  Bilirubin, UA     Negative Negative  Urobilinogen, Ur     0.2 - 1.0 mg/dL 0.2  Nitrite, UA     Negative Negative  Microscopic Examination      See below:   Component     Latest Ref Rng & Units 07/08/2020  WBC, UA     0 - 5 /hpf 11-30 (A)  RBC     0 - 2 /hpf >30 (A)  Epithelial Cells  (non renal)     0 - 10 /hpf 0-10  Crystals     N/A Present (A)  Crystal Type     N/A Amorphous Sediment  Bacteria, UA     None seen/Few Few   I have reviewed the labs.   Pertinent Imaging: Results for Thomas, [name] (MRN [id number]) as of 07/08/2020 11:31  Ref. Range 07/08/2020 11:26  Scan Result Unknown 54    Assessment & Plan:    1. Dysuria  -UA -urine sent for culture -Start Septra DS, twice daily x7 days will adjust if necessary if culture results are available -If urine culture is negative or symptoms persist despite antibiotics, may consider cystoscopy to rule out stricture formation  Return in about 6 months (around 01/07/2021) for KUB and office visit .  These notes generated with voice recognition software. I apologize for typographical errors.  Michiel Cowboy, PA-C  [facility] [address]  [address] [place], [place] [address] [phone]

## 2020-07-08 ENCOUNTER — Encounter: Payer: Self-pay | Admitting: Urology

## 2020-07-08 ENCOUNTER — Other Ambulatory Visit: Payer: Self-pay

## 2020-07-08 ENCOUNTER — Ambulatory Visit (INDEPENDENT_AMBULATORY_CARE_PROVIDER_SITE_OTHER): Payer: No Typology Code available for payment source | Admitting: Urology

## 2020-07-08 VITALS — BP 134/86 | HR 91 | Ht 69.0 in | Wt 211.0 lb

## 2020-07-08 DIAGNOSIS — R300 Dysuria: Secondary | ICD-10-CM | POA: Diagnosis not present

## 2020-07-08 LAB — BLADDER SCAN AMB NON-IMAGING: Scan Result: 54

## 2020-07-08 MED ORDER — SULFAMETHOXAZOLE-TRIMETHOPRIM 800-160 MG PO TABS: 1.0000 | ORAL_TABLET | Freq: Two times a day (BID) | ORAL | 0 refills | Status: DC

## 2020-07-09 LAB — URINALYSIS, COMPLETE
Bilirubin, UA: NEGATIVE
Glucose, UA: NEGATIVE
Ketones, UA: NEGATIVE
Nitrite, UA: NEGATIVE
Protein,UA: NEGATIVE
Specific Gravity, UA: 1.025 (ref 1.005–1.030)
Urobilinogen, Ur: 0.2 mg/dL (ref 0.2–1.0)
pH, UA: 7.5 (ref 5.0–7.5)

## 2020-07-09 LAB — MICROSCOPIC EXAMINATION: RBC, Urine: >30 /hpf — AB (ref 0–2)

## 2020-07-11 ENCOUNTER — Telehealth: Payer: Self-pay

## 2020-07-11 LAB — CULTURE, URINE COMPREHENSIVE

## 2020-07-11 MED ORDER — NITROFURANTOIN MONOHYD MACRO 100 MG PO CAPS
100.0000 mg | ORAL_CAPSULE | Freq: Two times a day (BID) | ORAL | 0 refills | Status: DC
Start: 2020-07-11 — End: 2020-07-22

## 2020-07-11 NOTE — Telephone Encounter (Signed)
-----   Message from Harle Battiest, PA-C sent at 07/11/2020  8:22 AM EDT ----- Please let Mr. Duhon know that his urine culture was positive and we need to change his antibiotic to Macrobid 100 mg, BID x 7 days.

## 2020-07-11 NOTE — Telephone Encounter (Signed)
Patient notified and script sent into pharmacy 

## 2020-07-18 ENCOUNTER — Telehealth: Payer: Self-pay | Admitting: *Deleted

## 2020-07-18 NOTE — Telephone Encounter (Signed)
He may take Azo to help with the dysuria between now and Tuesday. He should not take this longer than 2 days consecutively, may take 1 day off in between.  If he develops fever, chills, nausea, vomiting, flank pain, or scrotal swelling, he needs to be seen sooner or go to the ED.

## 2020-07-18 NOTE — Telephone Encounter (Signed)
Notified patient of information below. Patient states he has taken Azo already and it does not help and he also thinks the ABX are not working. Counseled patient to go to ED for F/C/N/V, scrotal swelling or flank pain. Patient verbalized understanding.

## 2020-07-18 NOTE — Telephone Encounter (Signed)
Pt calling complaining of burning with urination and urgency. Pt also states that after sex, no semen is coming out? Offered pt appt, his only day off is tuesdays, OV scheduled. Pt wanted to know is there anything to do until tueday?

## 2020-07-22 ENCOUNTER — Encounter: Payer: Self-pay | Admitting: Physician Assistant

## 2020-07-22 ENCOUNTER — Ambulatory Visit (INDEPENDENT_AMBULATORY_CARE_PROVIDER_SITE_OTHER): Payer: No Typology Code available for payment source | Admitting: Physician Assistant

## 2020-07-22 ENCOUNTER — Other Ambulatory Visit: Payer: Self-pay

## 2020-07-22 VITALS — BP 135/84 | HR 94 | Ht 69.0 in | Wt 210.0 lb

## 2020-07-22 DIAGNOSIS — N5314 Retrograde ejaculation: Secondary | ICD-10-CM

## 2020-07-22 DIAGNOSIS — R300 Dysuria: Secondary | ICD-10-CM

## 2020-07-22 LAB — URINALYSIS, COMPLETE
Bilirubin, UA: NEGATIVE
Glucose, UA: NEGATIVE
Nitrite, UA: NEGATIVE
Specific Gravity, UA: >1.03 — ABNORMAL HIGH (ref 1.005–1.030)
Urobilinogen, Ur: 0.2 mg/dL (ref 0.2–1.0)
pH, UA: 6 (ref 5.0–7.5)

## 2020-07-22 LAB — MICROSCOPIC EXAMINATION
RBC, Urine: >30 /HPF — AB (ref 0–2)
WBC, UA: >30 /HPF — AB (ref 0–5)

## 2020-07-22 MED ORDER — DOXYCYCLINE HYCLATE 100 MG PO CAPS: 100.0000 mg | ORAL_CAPSULE | Freq: Two times a day (BID) | ORAL | 0 refills | Status: DC

## 2020-07-22 MED ORDER — MIRABEGRON ER 25 MG PO TB24: 25.0000 mg | ORAL_TABLET | Freq: Every day | ORAL | 0 refills | Status: DC

## 2020-07-22 NOTE — Patient Instructions (Signed)
START Myrbetriq 25mg  once daily. I'll send your urine for culture today and call you if I need to send in antibiotics. I'll plan to see you back in clinic next month to recheck your symptoms.

## 2020-07-22 NOTE — Progress Notes (Signed)
07/22/2020 9:50 AM   Thomas [name] 10/07/1980 [id number]  CC: Chief Complaint  Patient presents with   Dysuria    HPI: Thomas [name] is a 40 y.o. male with PMH irritative voiding symptoms and bladder stone s/p cystolitholapaxy and TURP with Dr. Richardo Hanks on 06/20/2020 who presents today for evaluation of possible UTI.   He was seen in clinic by Michiel Cowboy on POD 18 with reports of a 10-day history of dysuria and urgency.  UA that day was notable for pyuria and microscopic hematuria and culture ultimately grew 25-50,000 CFU/mL staph epidermidis.  He was treated with empiric Bactrim and switched to culture appropriate Macrobid per culture results.  Today he reports persistent terminal dysuria that did not improve with Macrobid.  This is accompanied by strong urgency, frequency up to every 30 minutes without urge incontinence, and malodorous urine.  He reports a strong urinary stream following his recent urologic procedure.  He denies fever, chills, nausea, and vomiting.  Additionally, he reports intermittent retrograde ejaculation, however he no longer desires fertility.  He wonders if this is normal following his recent procedure.  In-office UA today positive for trace ketones, 3+ blood, 2+ protein, and 1+ leukocyte esterase; urine microscopy with >30 WBCs/HPF and >30 RBCs/HPF.  PMH: Past Medical History:  Diagnosis Date   Reflux    Renal disorder    kidney stones   Sinusitis     Surgical History: Past Surgical History:  Procedure Laterality Date   CYSTOSCOPY WITH LITHOLAPAXY N/A 06/20/2020   Procedure: CYSTOSCOPY WITH LITHOLAPAXY;  Surgeon: Sondra Come, MD;  Location: [facility];  Service: Urology;  Laterality: N/A;   TRANSURETHRAL RESECTION OF BLADDER TUMOR N/A 06/20/2020   Procedure: TRANSURETHRAL RESECTION OF PROSTATE;  Surgeon: Sondra Come, MD;  Location: [facility];  Service: Urology;  Laterality: N/A;    Home Medications:  Allergies as of 07/22/2020        Reactions   Penicillins Rash        Medication List        Accurate as of July 22, 2020  9:50 AM. If you have any questions, ask your nurse or doctor.          STOP taking these medications    nitrofurantoin (macrocrystal-monohydrate) 100 MG capsule Commonly known as: MACROBID Stopped by: Carman Ching, PA-C   sulfamethoxazole-trimethoprim 800-160 MG tablet Commonly known as: BACTRIM DS Stopped by: Carman Ching, PA-C        Allergies:  Allergies  Allergen Reactions   Penicillins Rash    Family History: Family History  Problem Relation Age of Onset   Breast cancer Mother    Pneumonia Father    COPD Sister    Diabetes Mellitus I Maternal Grandmother    Diabetes Mellitus I Paternal Grandmother    Heart disease Paternal Grandfather     Social History:   reports that he quit smoking about 21 months ago. His smoking use included cigarettes. He has never used smokeless tobacco. He reports current alcohol use. He reports that he does not use drugs.  Physical Exam: BP 135/84   Pulse 94   Ht 5\' 9"  (1.753 m)   Wt 210 lb (95.3 kg)   BMI 31.01 kg/m   Constitutional:  Alert and oriented, no acute distress, nontoxic appearing HEENT: [place], AT Cardiovascular: No clubbing, cyanosis, or edema Respiratory: Normal respiratory effort, no increased work of breathing Skin: No rashes, bruises or suspicious lesions Neurologic: Grossly intact, no focal deficits, moving all 4 extremities  Psychiatric: Normal mood and affect  Laboratory Data: Results for orders placed or performed in visit on 07/22/20  Microscopic Examination   Urine  Result Value Ref Range   WBC, UA >30 (A) 0 - 5 /hpf   RBC >30 (A) 0 - 2 /hpf   Epithelial Cells (non renal) 0-10 0 - 10 /hpf   Bacteria, UA Few None seen/Few  Urinalysis, Complete  Result Value Ref Range   Specific Gravity, UA >1.030 (H) 1.005 - 1.030   pH, UA 6.0 5.0 - 7.5   Color, UA Yellow Yellow   Appearance Ur Cloudy  (A) Clear   Leukocytes,UA 1+ (A) Negative   Protein,UA 2+ (A) Negative/Trace   Glucose, UA Negative Negative   Ketones, UA Trace (A) Negative   RBC, UA 3+ (A) Negative   Bilirubin, UA Negative Negative   Urobilinogen, Ur 0.2 0.2 - 1.0 mg/dL   Nitrite, UA Negative Negative   Microscopic Examination See below:    Assessment & Plan:   1. Dysuria Terminal dysuria associated with urgency and frequency.  UA today notable for pyuria and microscopic hematuria consistent with acute cystitis versus postoperative urine.  Will send for culture and defer antibiotic therapy today, treating as indicated.  In the meantime, I would like to start him on Myrbetriq for management of his overactive symptoms with plans for symptom recheck and PVR in 4 weeks - Urinalysis, Complete - CULTURE, URINE COMPREHENSIVE - mirabegron ER (MYRBETRIQ) 25 MG TB24 tablet; Take 1 tablet (25 mg total) by mouth daily.  Dispense: 28 tablet; Refill: 0  2. Retrograde ejaculation We discussed that this is a normal postoperative finding s/p TURP and may or may not improve over time.  Patient no longer desires fertility and retrograde ejaculation is intermittent.  If it worsens or he desires fertility in the future, may consider referral to fertility specialist.  Return in about 4 weeks (around 08/19/2020) for Symptom recheck with PVR.  Carman Ching, PA-C  [facility] [address], [address] [place], [place] [address] [phone]

## 2020-07-25 LAB — CULTURE, URINE COMPREHENSIVE

## 2020-07-28 ENCOUNTER — Telehealth: Payer: Self-pay

## 2020-07-28 NOTE — Telephone Encounter (Signed)
Notified patient as advised, patient expressed understanding.  

## 2020-07-28 NOTE — Telephone Encounter (Signed)
-----   Message from Carman Ching, [place] sent at 07/28/2020  8:21 AM EDT ----- Ucx negative, no abx needed. ----- Message ----- From: Nell Range Lab Results In Sent: 07/22/2020   4:36 PM EDT To: Carman Ching, PA-C

## 2020-08-19 ENCOUNTER — Encounter: Payer: Self-pay | Admitting: Physician Assistant

## 2020-08-19 ENCOUNTER — Ambulatory Visit (INDEPENDENT_AMBULATORY_CARE_PROVIDER_SITE_OTHER): Payer: No Typology Code available for payment source | Admitting: Physician Assistant

## 2020-08-19 ENCOUNTER — Other Ambulatory Visit: Payer: Self-pay

## 2020-08-19 VITALS — BP 133/84 | HR 89 | Ht 69.0 in | Wt 210.0 lb

## 2020-08-19 DIAGNOSIS — R3915 Urgency of urination: Secondary | ICD-10-CM

## 2020-08-19 LAB — BLADDER SCAN AMB NON-IMAGING: Scan Result: 11 mL

## 2020-08-19 MED ORDER — MIRABEGRON ER 50 MG PO TB24: 50.0000 mg | ORAL_TABLET | Freq: Every day | ORAL | 0 refills | Status: DC

## 2020-08-19 NOTE — Progress Notes (Signed)
08/19/2020 11:23 AM   Thomas [name] 06/09/1980 [id number]  CC: Chief Complaint  Patient presents with   Follow-up   HPI: Thomas [name] is a 40 y.o. male with PMH irritative voiding symptoms and bladder stone s/p cystolitholapaxy and TURP with Dr. Richardo Hanks on 06/20/2020 who presents today for symptom recheck on Myrbetriq 25 mg daily.   Today he reports significant improvement in his previously reported terminal dysuria as well as urgency on Myrbetriq, though he continues to have some bothersome urgency especially during the workday.  He denies a split or weak stream.  PVR 11 mL.  PMH: Past Medical History:  Diagnosis Date   Reflux    Renal disorder    kidney stones   Sinusitis     Surgical History: Past Surgical History:  Procedure Laterality Date   CYSTOSCOPY WITH LITHOLAPAXY N/A 06/20/2020   Procedure: CYSTOSCOPY WITH LITHOLAPAXY;  Surgeon: Sondra Come, MD;  Location: [facility];  Service: Urology;  Laterality: N/A;   TRANSURETHRAL RESECTION OF BLADDER TUMOR N/A 06/20/2020   Procedure: TRANSURETHRAL RESECTION OF PROSTATE;  Surgeon: Sondra Come, MD;  Location: [facility];  Service: Urology;  Laterality: N/A;    Home Medications:  Allergies as of 08/19/2020       Reactions   Penicillins Rash        Medication List        Accurate as of August 19, 2020 11:23 AM. If you have any questions, ask your nurse or doctor.          mirabegron ER 50 MG Tb24 tablet Commonly known as: MYRBETRIQ Take 1 tablet (50 mg total) by mouth daily. What changed:  medication strength how much to take Changed by: Carman Ching, PA-C        Allergies:  Allergies  Allergen Reactions   Penicillins Rash    Family History: Family History  Problem Relation Age of Onset   Breast cancer Mother    Pneumonia Father    COPD Sister    Diabetes Mellitus I Maternal Grandmother    Diabetes Mellitus I Paternal Grandmother    Heart disease Paternal Grandfather      Social History:   reports that he quit smoking about 22 months ago. His smoking use included cigarettes. He has never used smokeless tobacco. He reports current alcohol use. He reports that he does not use drugs.  Physical Exam: BP 133/84   Pulse 89   Ht 5\' 9"  (1.753 m)   Wt 210 lb (95.3 kg)   BMI 31.01 kg/m   Constitutional:  Alert and oriented, no acute distress, nontoxic appearing HEENT: [place], AT Cardiovascular: No clubbing, cyanosis, or edema Respiratory: Normal respiratory effort, no increased work of breathing Skin: No rashes, bruises or suspicious lesions Neurologic: Grossly intact, no focal deficits, moving all 4 extremities Psychiatric: Normal mood and affect  Laboratory Data: Results for orders placed or performed in visit on 08/19/20  Bladder Scan (Post Void Residual) in office  Result Value Ref Range   Scan Result 11 mL   Assessment & Plan:   1. Urinary urgency Improved on Myrbetriq 25 mg, however patient is not yet at treatment goal.  Offered him continued Myrbetriq 25 mg versus increased dose to 50 mg and he elected for the latter.  Samples provided today.  We will plan for symptom recheck and PVR in 4 weeks.  We will also plan to repeat UA at that time.  He will be sufficiently far out from recent urologic surgery that  his UA should appear clear; if it is infected appearing, will consider cystoscopy for evaluation of possible underlying postoperative stricture. - Bladder Scan (Post Void Residual) in office - mirabegron ER (MYRBETRIQ) 50 MG TB24 tablet; Take 1 tablet (50 mg total) by mouth daily.  Dispense: 28 tablet; Refill: 0  Return in about 4 weeks (around 09/16/2020) for Symptom recheck with PVR + UA.  Carman Ching, PA-C  [facility] [address], [address] [place], [place] [address] [phone]

## 2020-09-19 ENCOUNTER — Other Ambulatory Visit: Payer: Self-pay

## 2020-09-19 ENCOUNTER — Encounter: Payer: Self-pay | Admitting: Physician Assistant

## 2020-09-19 ENCOUNTER — Ambulatory Visit (INDEPENDENT_AMBULATORY_CARE_PROVIDER_SITE_OTHER): Payer: No Typology Code available for payment source | Admitting: Physician Assistant

## 2020-09-19 VITALS — BP 123/85 | HR 91 | Ht 69.0 in | Wt 211.0 lb

## 2020-09-19 DIAGNOSIS — R3915 Urgency of urination: Secondary | ICD-10-CM

## 2020-09-19 LAB — URINALYSIS, COMPLETE
Bilirubin, UA: NEGATIVE
Glucose, UA: NEGATIVE
Ketones, UA: NEGATIVE
Leukocytes,UA: NEGATIVE
Nitrite, UA: NEGATIVE
Protein,UA: NEGATIVE
RBC, UA: NEGATIVE
Specific Gravity, UA: 1.02 (ref 1.005–1.030)
Urobilinogen, Ur: 0.2 mg/dL (ref 0.2–1.0)
pH, UA: 8.5 — ABNORMAL HIGH (ref 5.0–7.5)

## 2020-09-19 LAB — MICROSCOPIC EXAMINATION: Bacteria, UA: NONE SEEN

## 2020-09-19 MED ORDER — MIRABEGRON ER 50 MG PO TB24: 50.0000 mg | ORAL_TABLET | Freq: Every day | ORAL | 11 refills | Status: DC

## 2020-09-19 NOTE — Progress Notes (Signed)
09/19/2020 4:23 PM   Thomas [name] [birth year] [id number]  CC: Chief Complaint  Patient presents with   Follow-up    follow-up   HPI: Thomas [name] is a 40 y.o. male with PMH irritative voiding symptoms and bladder stone s/p cystolitholapaxy and TURP with Dr. Richardo Hanks on 06/20/2020 who presents today for symptom recheck on Myrbetriq 50 mg daily.   Today he reports symptomatic improvement on Myrbetriq 50 mg versus 25 mg.  He now is voiding only once overnight and overall is very pleased with his progress.  He wishes to continue the medication.  In-office UA and microscopy today pan negative. PVR 104mL.  PMH: Past Medical History:  Diagnosis Date   Reflux    Renal disorder    kidney stones   Sinusitis     Surgical History: Past Surgical History:  Procedure Laterality Date   CYSTOSCOPY WITH LITHOLAPAXY N/A 06/20/2020   Procedure: CYSTOSCOPY WITH LITHOLAPAXY;  Surgeon: Sondra Come, MD;  Location: [facility];  Service: Urology;  Laterality: N/A;   TRANSURETHRAL RESECTION OF BLADDER TUMOR N/A 06/20/2020   Procedure: TRANSURETHRAL RESECTION OF PROSTATE;  Surgeon: Sondra Come, MD;  Location: [facility];  Service: Urology;  Laterality: N/A;    Home Medications:  Allergies as of 09/19/2020       Reactions   Penicillins Rash        Medication List        Accurate as of September 19, 2020  4:23 PM. If you have any questions, ask your nurse or doctor.          mirabegron ER 50 MG Tb24 tablet Commonly known as: MYRBETRIQ Take 1 tablet (50 mg total) by mouth daily.        Allergies:  Allergies  Allergen Reactions   Penicillins Rash    Family History: Family History  Problem Relation Age of Onset   Breast cancer Mother    Pneumonia Father    COPD Sister    Diabetes Mellitus I Maternal Grandmother    Diabetes Mellitus I Paternal Grandmother    Heart disease Paternal Grandfather     Social History:   reports that he quit smoking about 23 months  ago. His smoking use included cigarettes. He has never used smokeless tobacco. He reports current alcohol use. He reports that he does not use drugs.  Physical Exam: BP 123/85   Pulse 91   Ht 5\' 9"  (1.753 m)   Wt 211 lb (95.7 kg)   BMI 31.16 kg/m   Constitutional:  Alert and oriented, no acute distress, nontoxic appearing HEENT: [place], AT Cardiovascular: No clubbing, cyanosis, or edema Respiratory: Normal respiratory effort, no increased work of breathing Skin: No rashes, bruises or suspicious lesions Neurologic: Grossly intact, no focal deficits, moving all 4 extremities Psychiatric: Normal mood and affect  Laboratory Data: Results for orders placed or performed in visit on 09/19/20  Microscopic Examination   Urine  Result Value Ref Range   WBC, UA 0-5 0 - 5 /hpf   RBC 0-2 0 - 2 /hpf   Epithelial Cells (non renal) 0-10 0 - 10 /hpf   Bacteria, UA None seen None seen/Few  Urinalysis, Complete  Result Value Ref Range   Specific Gravity, UA 1.020 1.005 - 1.030   pH, UA 8.5 (H) 5.0 - 7.5   Color, UA Yellow Yellow   Appearance Ur Hazy (A) Clear   Leukocytes,UA Negative Negative   Protein,UA Negative Negative/Trace   Glucose, UA Negative Negative  Ketones, UA Negative Negative   RBC, UA Negative Negative   Bilirubin, UA Negative Negative   Urobilinogen, Ur 0.2 0.2 - 1.0 mg/dL   Nitrite, UA Negative Negative   Microscopic Examination See below:    Assessment & Plan:   1. Urinary urgency Significant improvement on Myrbetriq 50 mg daily.  We will plan to continue this.  He is emptying appropriately on this medication.  Additionally, follow-up UA is completely benign today, low concern for postop stricture.  No further intervention indicated. - BLADDER SCAN AMB NON-IMAGING - Urinalysis, Complete - mirabegron ER (MYRBETRIQ) 50 MG TB24 tablet; Take 1 tablet (50 mg total) by mouth daily.  Dispense: 30 tablet; Refill: 11  Return if symptoms worsen or fail to improve.  Carman Ching, PA-C  [facility] [address] [place], [place] [address] [phone]

## 2020-09-24 ENCOUNTER — Other Ambulatory Visit: Payer: Self-pay | Admitting: Family Medicine

## 2020-09-24 DIAGNOSIS — R3915 Urgency of urination: Secondary | ICD-10-CM

## 2020-09-24 MED ORDER — MIRABEGRON ER 50 MG PO TB24: 50.0000 mg | ORAL_TABLET | Freq: Every day | ORAL | 11 refills | Status: DC

## 2021-01-07 NOTE — Progress Notes (Deleted)
01/08/2021 12:32 PM   Xan Ingraham [birth year] [id number]  Referring provider: No referring provider defined for this encounter.  No chief complaint on file.  Urological history 1. Bladder stone -s/p Cystolitholapaxy(1.5 cm bladder stone) and transurethral resection of prostate on 06/20/2020 -pathology demonstrated inflammatory tissue, no malignancy  2. Urinary urgency -contributing factors of age and smoking history -PVR *** -managed with Myrbetriq 50 mg daily   HPI: Thomas [name] is a 40 y.o. male who presents today for 6 month follow up with KUB.  KUB ***  PMH: Past Medical History:  Diagnosis Date   Reflux    Renal disorder    kidney stones   Sinusitis     Surgical History: Past Surgical History:  Procedure Laterality Date   CYSTOSCOPY WITH LITHOLAPAXY N/A 06/20/2020   Procedure: CYSTOSCOPY WITH LITHOLAPAXY;  Surgeon: Sondra Come, MD;  Location: [facility];  Service: Urology;  Laterality: N/A;   TRANSURETHRAL RESECTION OF BLADDER TUMOR N/A 06/20/2020   Procedure: TRANSURETHRAL RESECTION OF PROSTATE;  Surgeon: Sondra Come, MD;  Location: [facility];  Service: Urology;  Laterality: N/A;    Home Medications:  Allergies as of 01/08/2021       Reactions   Penicillins Rash        Medication List        Accurate as of January 07, 2021 12:32 PM. If you have any questions, ask your nurse or doctor.          mirabegron ER 50 MG Tb24 tablet Commonly known as: MYRBETRIQ Take 1 tablet (50 mg total) by mouth daily.        Allergies:  Allergies  Allergen Reactions   Penicillins Rash    Family History: Family History  Problem Relation Age of Onset   Breast cancer Mother    Pneumonia Father    COPD Sister    Diabetes Mellitus I Maternal Grandmother    Diabetes Mellitus I Paternal Grandmother    Heart disease Paternal Grandfather     Social History:  reports that he quit smoking about 2 years ago. His smoking use included  cigarettes. He has never used smokeless tobacco. He reports current alcohol use. He reports that he does not use drugs.  ROS: Pertinent ROS in HPI  Physical Exam: There were no vitals taken for this visit.  Constitutional:  Well nourished. Alert and oriented, No acute distress. HEENT: [place] AT, moist mucus membranes.  Trachea midline, no masses. Cardiovascular: No clubbing, cyanosis, or edema. Respiratory: Normal respiratory effort, no increased work of breathing. GI: Abdomen is soft, non tender, non distended, no abdominal masses. Liver and spleen not palpable.  No hernias appreciated.  Stool sample for occult testing is not indicated.   GU: No CVA tenderness.  No bladder fullness or masses.  Patient with circumcised/uncircumcised phallus. ***Foreskin easily retracted***  Urethral meatus is patent.  No penile discharge. No penile lesions or rashes. Scrotum without lesions, cysts, rashes and/or edema.  Testicles are located scrotally bilaterally. No masses are appreciated in the testicles. Left and right epididymis are normal. Rectal: Patient with  normal sphincter tone. Anus and perineum without scarring or rashes. No rectal masses are appreciated. Prostate is approximately *** grams, *** nodules are appreciated. Seminal vesicles are normal. Skin: No rashes, bruises or suspicious lesions. Lymph: No cervical or inguinal adenopathy. Neurologic: Grossly intact, no focal deficits, moving all 4 extremities. Psychiatric: Normal mood and affect.  Laboratory Data: Lab Results  Component Value Date   WBC 7.8 06/20/2020  HGB 16.4 06/20/2020   HCT 47.9 06/20/2020   MCV 83.9 06/20/2020   PLT 335 06/20/2020    Lab Results  Component Value Date   CREATININE 0.99 06/20/2020  I have reviewed the labs.   Pertinent Imaging: ***  Assessment & Plan:  ***  1. Bladder stone  2. Urinary urgency  No follow-ups on file.  These notes generated with voice recognition software. I apologize for  typographical errors.  Michiel Cowboy, PA-C  [facility] [address]  [address] [place], [place] [address] [phone]

## 2021-01-08 ENCOUNTER — Ambulatory Visit: Payer: No Typology Code available for payment source | Admitting: Urology

## 2021-01-08 ENCOUNTER — Other Ambulatory Visit: Payer: Self-pay | Admitting: *Deleted

## 2021-01-08 DIAGNOSIS — R3915 Urgency of urination: Secondary | ICD-10-CM

## 2021-01-08 DIAGNOSIS — N210 Calculus in bladder: Secondary | ICD-10-CM

## 2021-01-09 ENCOUNTER — Encounter: Payer: Self-pay | Admitting: Urology

## 2021-07-18 IMAGING — CR DG ABDOMEN 1V
2 series · 2 of 2 positions shown · non-contrast
Comparison: CT 08/12/2017

CLINICAL DATA: Kidney stone.  Dysuria.

EXAM:
ABDOMEN - 1 VIEW

[abdomen kub (1 of 2)]
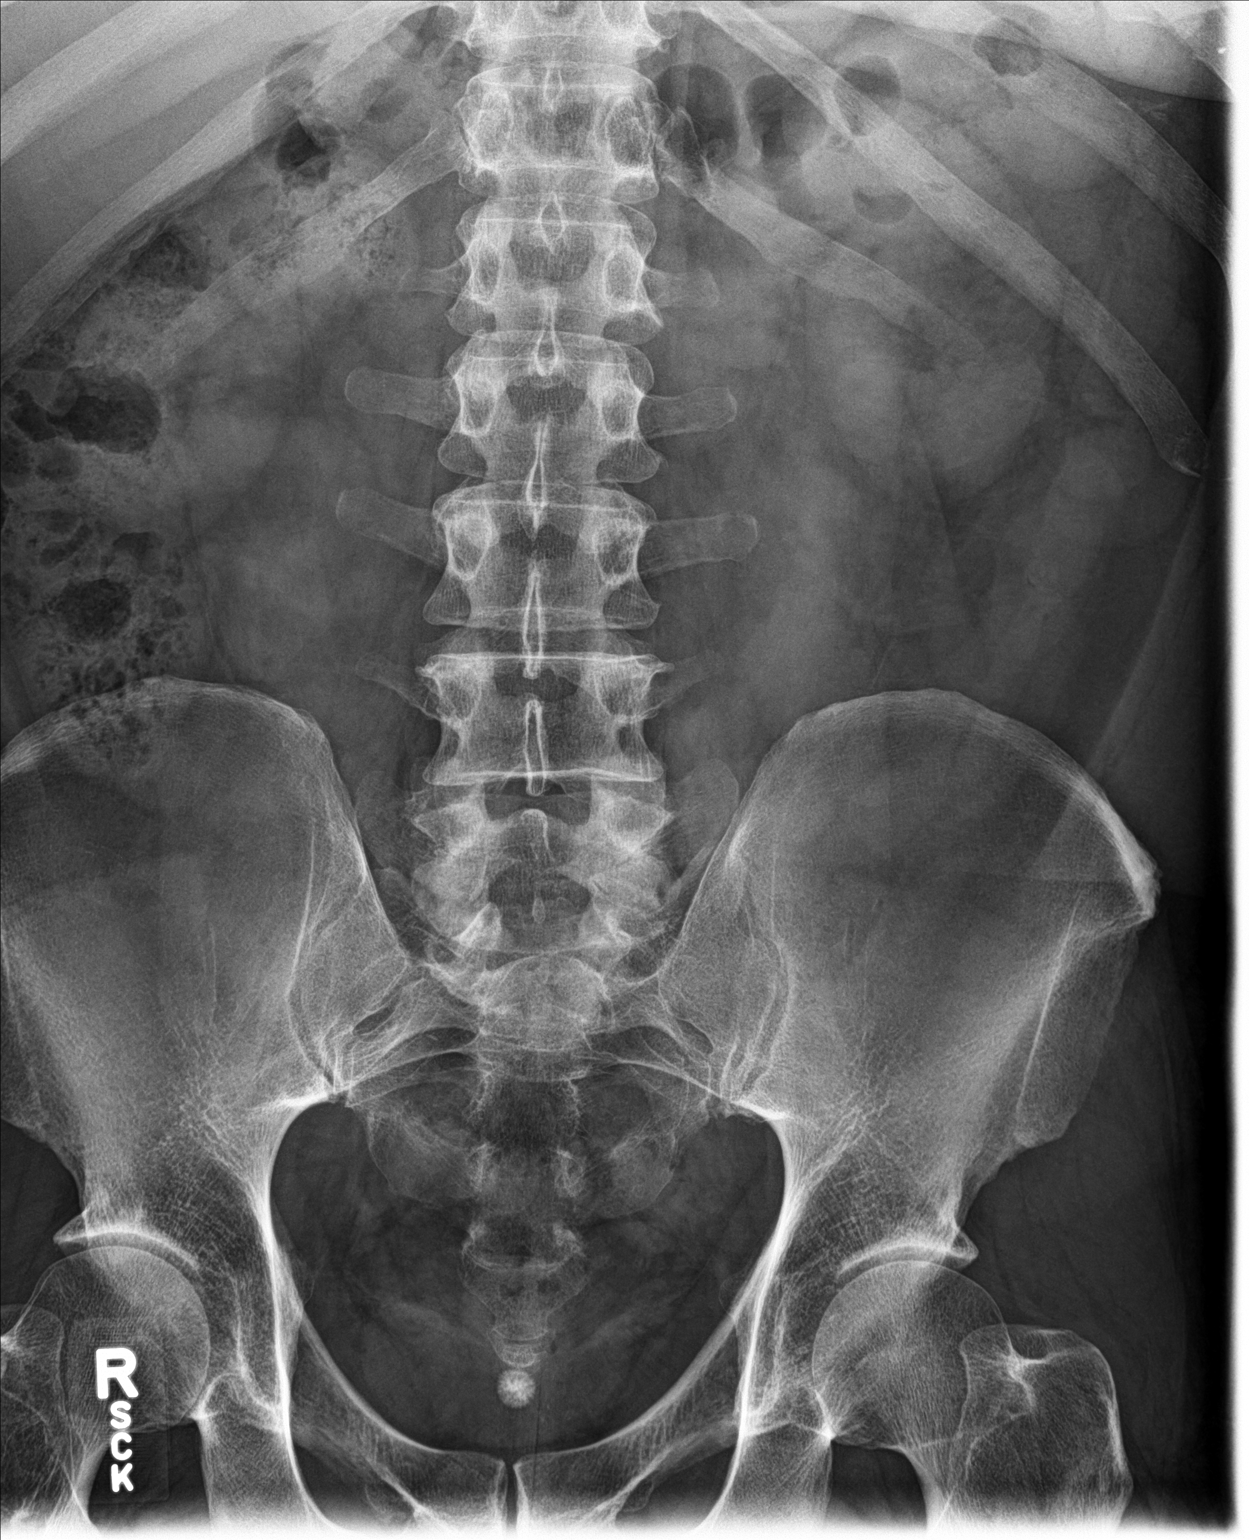

[abdomen kub (2 of 2)]
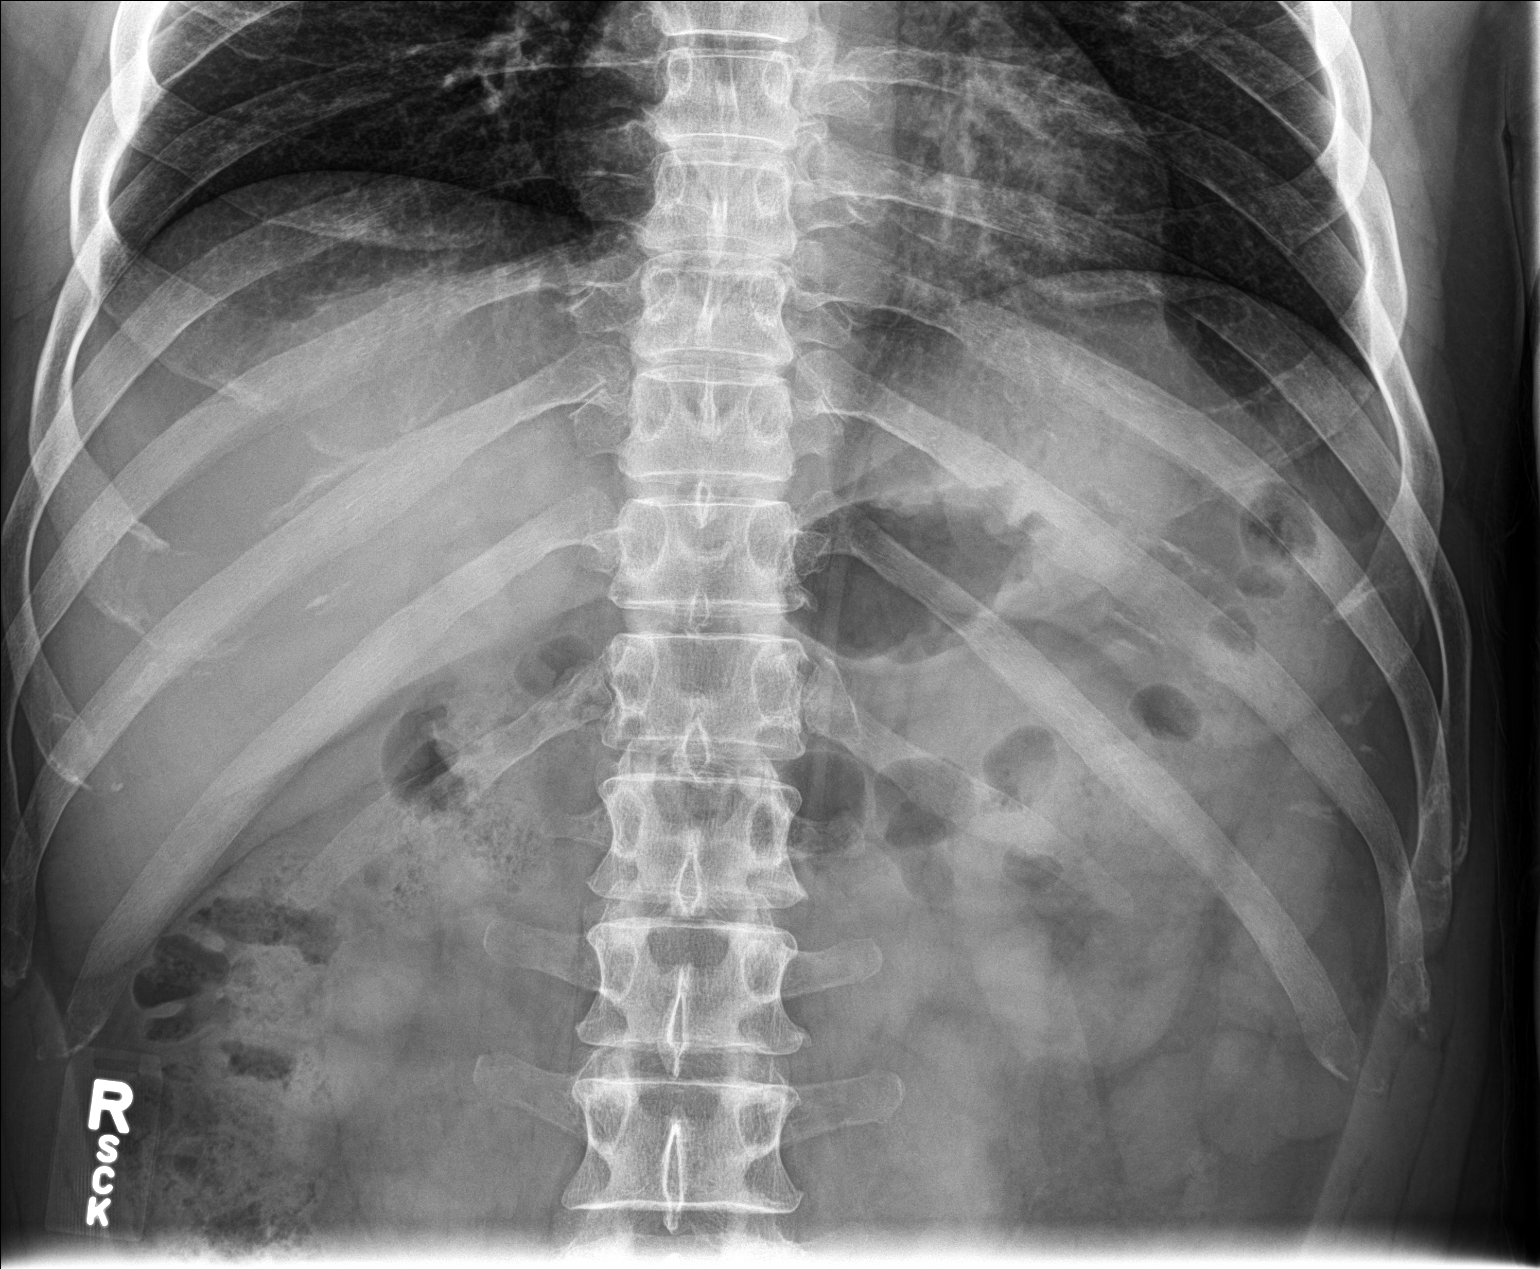

[2 of 2 positions shown; findings below may reference images not displayed]

FINDINGS: No visualized calculi project over the renal beds, course of the
ureters, or in the pelvis. Normal bowel gas pattern with small
volume of colonic stool. No concerning intraabdominal mass effect.
Lung bases are clear. No acute osseous abnormalities are seen.
IMPRESSION: No visualized urolithiasis.

## 2021-10-25 ENCOUNTER — Encounter: Payer: Self-pay | Admitting: Emergency Medicine

## 2021-10-25 ENCOUNTER — Ambulatory Visit
Admission: EM | Admit: 2021-10-25 | Discharge: 2021-10-25 | Disposition: A | Discharge status: Home / Self Care | Payer: BC Managed Care – PPO | Attending: Nurse Practitioner | Admitting: Nurse Practitioner

## 2021-10-25 DIAGNOSIS — Z1152 Encounter for screening for COVID-19: Secondary | ICD-10-CM | POA: Diagnosis present

## 2021-10-25 DIAGNOSIS — J069 Acute upper respiratory infection, unspecified: Secondary | ICD-10-CM | POA: Diagnosis present

## 2021-10-25 LAB — SARS CORONAVIRUS 2 (TAT 6-24 HRS): SARS Coronavirus 2: NEGATIVE

## 2021-10-25 MED ORDER — BENZONATATE 100 MG PO CAPS: 100.0000 mg | ORAL_CAPSULE | Freq: Three times a day (TID) | ORAL | 0 refills | Status: DC | PRN

## 2021-10-25 NOTE — ED Triage Notes (Signed)
Cough and sore throat since yesterday.  States he has been feeling SOB for a few days.  At home covid test was negative last night.  Some nasal congestion.

## 2021-10-25 NOTE — ED Provider Notes (Signed)
[facility]    CSN: [id number] Arrival date & time: 10/25/21  0810      History   Chief Complaint No chief complaint on file.   HPI Thomas [name] is a 41 y.o. male.   Patient presents with a few days of dry cough, sensation like he cannot take in a deep breath, nasal congestion, postnasal drainage, sneezing, sore throat, headache, and fatigue.  He denies fever, body aches, chills, wheezing, chest pain or tightness, chest congestion, tooth or ear pain or pressure, abdominal pain, nausea/vomiting, diarrhea, and decreased appetite.  No new rash.  No known sick contacts.  Has been taking vitamin C, vitamin D, and Mucinex nasal spray without relief.  Reports he took a at home COVID test yesterday that was negative.    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 Authorizing Provider  benzonatate (TESSALON) 100 MG capsule Take 1 capsule (100 mg total) by mouth 3 (three) times daily as needed for cough. Do not take with alcohol or while driving or operating heavy machinery 10/25/21  Yes Noemi Chapel A, NP  esomeprazole (NEXIUM) 20 MG capsule Take 20 mg by mouth daily at 12 noon.   Yes [provider]  mirabegron ER (MYRBETRIQ) 50 MG TB24 tablet Take 1 tablet (50 mg total) by mouth daily. 09/24/20    Debroah Loop, PA-C    Family History Family History  Problem Relation Age of Onset   Breast cancer Mother    Pneumonia Father    COPD Sister    Diabetes Mellitus I Maternal Grandmother    Diabetes Mellitus I Paternal Grandmother    Heart disease Paternal Grandfather     Social History Social History   Tobacco Use   Smoking status: Former    Packs/day: 0.00    Years: 0.00    Total pack years: 0.00    Types: Cigarettes    Quit date: 10/18/2018    Years since quitting: 3.0   Smokeless tobacco: Never  Vaping Use   Vaping Use: Never used  Substance Use Topics   Alcohol use: Yes    Comment: occasionally   Drug use: No     Allergies   Penicillins   Review of Systems Review of Systems Per HPI  Physical Exam Triage Vital Signs ED Triage Vitals [10/25/21 0817]  Enc Vitals Group     BP (!) 146/101     Pulse Rate 96     Resp 16     Temp 97.7 F (36.5 C)     Temp Source Oral     SpO2 96 %     Weight      Height      Head Circumference      Peak Flow  Pain Score 0     Pain Loc      Pain Edu?      Excl. in [place]?    No data found.  Updated Vital Signs BP (!) 146/101 (BP Location: Right Arm)   Pulse 96   Temp 97.7 F (36.5 C) (Oral)   Resp 16   SpO2 96%   Visual Acuity Right Eye Distance:   Left Eye Distance:   Bilateral Distance:    Right Eye Near:   Left Eye Near:    Bilateral Near:     Physical Exam Vitals and nursing note reviewed.  Constitutional:      General: He is not in acute distress.    Appearance: Normal appearance. He is not ill-appearing or toxic-appearing.  HENT:     Head: Normocephalic and atraumatic.     Right Ear: Tympanic membrane, ear canal and external ear normal.     Left Ear: Tympanic membrane, ear canal and external ear normal.     Nose: Congestion present. No rhinorrhea.     Mouth/Throat:     Mouth: Mucous membranes are moist.     Pharynx: Oropharynx is clear. Posterior oropharyngeal erythema present. No  oropharyngeal exudate.  Eyes:     General: No scleral icterus.    Extraocular Movements: Extraocular movements intact.  Cardiovascular:     Rate and Rhythm: Normal rate and regular rhythm.  Pulmonary:     Effort: Pulmonary effort is normal. No respiratory distress.     Breath sounds: Normal breath sounds. No wheezing, rhonchi or rales.  Abdominal:     General: Abdomen is flat. Bowel sounds are normal. There is no distension.     Palpations: Abdomen is soft.     Tenderness: There is no abdominal tenderness.  Musculoskeletal:     Cervical back: Normal range of motion and neck supple.  Lymphadenopathy:     Cervical: No cervical adenopathy.  Skin:    General: Skin is warm and dry.     Coloration: Skin is not jaundiced or pale.     Findings: No erythema or rash.  Neurological:     Mental Status: He is alert and oriented to person, place, and time.     Motor: No weakness.  Psychiatric:        Behavior: Behavior is cooperative.      UC Treatments / Results  Labs (all labs ordered are listed, but only abnormal results are displayed) Labs Reviewed  SARS CORONAVIRUS 2 (TAT 6-24 HRS)    EKG   Radiology No results found.  Procedures Procedures (including critical care time)  Medications Ordered in UC Medications - No data to display  Initial Impression / Assessment and Plan / UC Course  I have reviewed the triage vital signs and the nursing notes.  Pertinent labs & imaging results that were available during my care of the patient were reviewed by me and considered in my medical decision making (see chart for details).    Patient is well-appearing, slightly hypertensive, likely secondary to illness, afebrile, not tachycardic, not tachypneic, oxygenating well on room air.  Reassurance provided that symptoms are consistent with a viral upper respiratory infection.  COVID-19 testing obtained.  Supportive care discussed.  Return and ER precautions discussed.  Start [employer] as needed for dry cough.  The patient was given the opportunity to ask questions.  All questions answered to their satisfaction.  The patient is in agreement to this plan.  Final Clinical Impressions(s) / UC Diagnoses  Final diagnoses:  Encounter for screening for COVID-19  Viral URI with cough     Discharge Instructions      Your symptoms and exam findings are most consistent with a viral upper respiratory infection. These usually run their course in about 10 days.  If your symptoms last longer than 10 days without improvement, please follow up with your primary care provider.  If your symptoms, worsen, please go to the Emergency Room.    We have tested you today for COVID-19.  You will see the results in Mychart and we will call you with positive results.    Please stay home and isolate until you are aware of the results.    Some things that can make you feel better are: - Increased rest - Increasing fluid with water/sugar free electrolytes - Acetaminophen and ibuprofen as needed for fever/pain.  - Salt water gargling, chloraseptic spray and throat lozenges - OTC guaifenesin (Mucinex) 600 mg twice daily.  - Saline sinus flushes or a neti pot.  - Humidifying the air. -Cough Perles every 8 hours as as needed to suppress a dry cough.      ED Prescriptions     Medication Sig Dispense Auth. Provider   benzonatate (TESSALON) 100 MG capsule Take 1 capsule (100 mg total) by mouth 3 (three) times daily as needed for cough. Do not take with alcohol or while driving or operating heavy machinery 21 capsule Eulogio Bear, NP      PDMP not reviewed this encounter.   Eulogio Bear, NP 10/25/21 [phone]

## 2021-10-25 NOTE — Discharge Instructions (Addendum)
Your symptoms and exam findings are most consistent with a viral upper respiratory infection. These usually run their course in about 10 days.  If your symptoms last longer than 10 days without improvement, please follow up with your primary care provider.  If your symptoms, worsen, please go to the Emergency Room.    We have tested you today for COVID-19.  You will see the results in Mychart and we will call you with positive results.    Please stay home and isolate until you are aware of the results.    Some things that can make you feel better are: - Increased rest - Increasing fluid with water/sugar free electrolytes - Acetaminophen and ibuprofen as needed for fever/pain.  - Salt water gargling, chloraseptic spray and throat lozenges - OTC guaifenesin (Mucinex) 600 mg twice daily.  - Saline sinus flushes or a neti pot.  - Humidifying the air. -Cough Perles every 8 hours as as needed to suppress a dry cough.

## 2021-10-26 ENCOUNTER — Ambulatory Visit (INDEPENDENT_AMBULATORY_CARE_PROVIDER_SITE_OTHER): Payer: BC Managed Care – PPO

## 2021-10-26 ENCOUNTER — Ambulatory Visit
Admission: EM | Admit: 2021-10-26 | Discharge: 2021-10-26 | Disposition: A | Discharge status: Home / Self Care | Payer: BC Managed Care – PPO | Attending: Family Medicine | Admitting: Family Medicine

## 2021-10-26 DIAGNOSIS — J309 Allergic rhinitis, unspecified: Secondary | ICD-10-CM | POA: Diagnosis not present

## 2021-10-26 DIAGNOSIS — J0100 Acute maxillary sinusitis, unspecified: Secondary | ICD-10-CM

## 2021-10-26 DIAGNOSIS — R059 Cough, unspecified: Secondary | ICD-10-CM | POA: Diagnosis not present

## 2021-10-26 DIAGNOSIS — R0602 Shortness of breath: Secondary | ICD-10-CM

## 2021-10-26 MED ORDER — AZITHROMYCIN 250 MG PO TABS: 250.0000 mg | ORAL_TABLET | Freq: Every day | ORAL | 0 refills | Status: DC

## 2021-10-26 MED ORDER — BENZONATATE 200 MG PO CAPS: 200.0000 mg | ORAL_CAPSULE | Freq: Three times a day (TID) | ORAL | 0 refills | Status: AC | PRN

## 2021-10-26 MED ORDER — PREDNISONE 20 MG PO TABS: ORAL_TABLET | ORAL | 0 refills | Status: DC

## 2021-10-26 MED ORDER — FEXOFENADINE HCL 180 MG PO TABS: 180.0000 mg | ORAL_TABLET | Freq: Every day | ORAL | 0 refills | Status: DC

## 2021-10-26 NOTE — ED Triage Notes (Signed)
Pt presents with c/o SOB, cough, and congestion that began 3-4 days ago. Pt seen at [facility] yesterday and had negative covid.

## 2021-10-26 NOTE — Discharge Instructions (Addendum)
Advised patient of CXR results with hard copy provided to patient.  Advised patient to take medication as directed with food to completion.  Advised patient to take prednisone and Allegra with Zithromax daily for the next 5 days.  Advised may use Allegra as needed afterwards for concurrent postnasal drip/drainage.  Advised may use Tessalon Perles for cough daily or as needed.  Encouraged patient to increase daily water intake while taking these medications.  Advised if symptoms worsen and/or unresolved please follow-up with PCP or here for further evaluation.

## 2021-10-26 NOTE — ED Provider Notes (Signed)
[facility]    CSN: [id number] Arrival date & time: 10/26/21  1057      History   Chief Complaint Chief Complaint  Patient presents with   Shortness of Breath   Cough   Epistaxis    HPI Thomas [name] is a 41 y.o. male.   HPI 41 year old male presents with sinus nasal congestion, shortness of breath, cough, and nosebleed that began 3 to 4 days ago.  Patient was seen at our UC yesterday and had negative COVID-19 test.  Patient was prescribed Tessalon Perles.  PMH significant for obesity, sinusitis, reflux and renal disorder.  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 Authorizing Provider  azithromycin (ZITHROMAX) 250 MG tablet Take 1 tablet (250 mg total) by mouth daily. Take first 2 tablets together, then 1 every day until finished. 10/26/21  Yes Trevor Iha, FNP  benzonatate (TESSALON) 200 MG capsule Take 1 capsule (200 mg total) by mouth 3 (three) times daily as needed for up to 7 days. 10/26/21 11/02/21 Yes Trevor Iha, FNP  fexofenadine [facility] ALLERGY) 180 MG tablet Take 1 tablet (180 mg total) by mouth daily for 15 days. 10/26/21 11/10/21 Yes Trevor Iha, FNP  predniSONE (DELTASONE) 20 MG tablet Take 3 tabs PO daily x 5 days. 10/26/21  Yes Trevor Iha, FNP  esomeprazole (NEXIUM) 20 MG  capsule Take 20 mg by mouth daily at 12 noon.    [provider]  mirabegron ER (MYRBETRIQ) 50 MG TB24 tablet Take 1 tablet (50 mg total) by mouth daily. 09/24/20   Carman Ching, PA-C    Family History Family History  Problem Relation Age of Onset   Breast cancer Mother    Pneumonia Father    COPD Sister    Diabetes Mellitus I Maternal Grandmother    Diabetes Mellitus I Paternal Grandmother    Heart disease Paternal Grandfather     Social History Social History   Tobacco Use   Smoking status: Former    Packs/day: 0.00    Years: 0.00    Total pack years: 0.00    Types: Cigarettes    Quit date: 10/18/2018    Years since quitting: 3.0   Smokeless tobacco: Never  Vaping Use   Vaping Use: Never used  Substance Use Topics   Alcohol use: Yes    Comment: occasionally   Drug use: No     Allergies   Penicillins   Review of Systems Review of Systems   Physical Exam Triage Vital Signs ED Triage Vitals [10/26/21 1115]  Enc Vitals Group     BP 131/88     Pulse Rate 81     Resp 14     Temp 98.8 F (37.1 C)     Temp Source Oral  SpO2 100 %     Weight      Height      Head Circumference      Peak Flow      Pain Score 0     Pain Loc      Pain Edu?      Excl. in [place]?    No data found.  Updated Vital Signs BP 131/88 (BP Location: Right Arm)   Pulse 81   Temp 98.8 F (37.1 C) (Oral)   Resp 14   SpO2 100%   Physical Exam Vitals and nursing note reviewed.  Constitutional:      Appearance: Normal appearance. He is obese. He is ill-appearing.  HENT:     Head: Normocephalic and atraumatic.     Right Ear: Tympanic membrane and external ear normal.     Left Ear: Tympanic membrane and external ear normal.     Ears:     Comments: Moderate eustachian tube dysfunction noted bilaterally    Nose:     Comments: Turbinates are erythematous/edematous    Mouth/Throat:     Mouth: Mucous membranes are moist.     Pharynx: Oropharynx is clear.      Comments: Significant amount of clear drainage posterior oropharynx noted Eyes:     Extraocular Movements: Extraocular movements intact.     Conjunctiva/sclera: Conjunctivae normal.     Pupils: Pupils are equal, round, and reactive to light.  Cardiovascular:     Rate and Rhythm: Normal rate and regular rhythm.     Pulses: Normal pulses.     Heart sounds: Normal heart sounds.  Pulmonary:     Effort: Pulmonary effort is normal.     Breath sounds: Normal breath sounds. No wheezing, rhonchi or rales.  Musculoskeletal:        General: Normal range of motion.     Cervical back: Normal range of motion and neck supple.  Skin:    General: Skin is warm and dry.  Neurological:     General: No focal deficit present.     Mental Status: He is alert and oriented to person, place, and time.     UC Treatments / Results  Labs (all labs ordered are listed, but only abnormal results are displayed) Labs Reviewed - No data to display  EKG   Radiology No results found.  Procedures Procedures (including critical care time)  Medications Ordered in UC Medications - No data to display  Initial Impression / Assessment and Plan / UC Course  I have reviewed the triage vital signs and the nursing notes.  Pertinent labs & imaging results that were available during my care of the patient were reviewed by me and considered in my medical decision making (see chart for details).     MDM: 1.  Subacute maxillary sinusitis-Zithromax, 2.  Cough-CXR revealed above, Rx'd prednisone, Tessalon Perles; 3.  Allergic rhinitis-Rx'd Allegra. Advised patient of CXR results with hard copy provided to patient.  Advised patient to take medication as directed with food to completion.  Advised patient to take prednisone and Allegra with Zithromax daily for the next 5 days.  Advised may use Allegra as needed afterwards for concurrent postnasal drip/drainage.  Advised may use Tessalon Perles for cough daily or as needed.   Encouraged patient to increase daily water intake while taking these medications.  Advised if symptoms worsen and/or unresolved please follow-up with PCP or here for further evaluation.  Patient discharged home, hemodynamically stable. Final Clinical Impressions(s) / UC Diagnoses  Final diagnoses:  Cough, unspecified type  Subacute maxillary sinusitis  Allergic rhinitis, unspecified seasonality, unspecified trigger     Discharge Instructions      Advised patient of CXR results with hard copy provided to patient.  Advised patient to take medication as directed with food to completion.  Advised patient to take prednisone and Allegra with Zithromax daily for the next 5 days.  Advised may use Allegra as needed afterwards for concurrent postnasal drip/drainage.  Advised may use Tessalon Perles for cough daily or as needed.  Encouraged patient to increase daily water intake while taking these medications.  Advised if symptoms worsen and/or unresolved please follow-up with PCP or here for further evaluation.     ED Prescriptions     Medication Sig Dispense Auth. Provider   azithromycin (ZITHROMAX) 250 MG tablet Take 1 tablet (250 mg total) by mouth daily. Take first 2 tablets together, then 1 every day until finished. 6 tablet Eliezer Lofts, FNP   predniSONE (DELTASONE) 20 MG tablet Take 3 tabs PO daily x 5 days. 15 tablet Eliezer Lofts, FNP   fexofenadine [facility] ALLERGY) 180 MG tablet Take 1 tablet (180 mg total) by mouth daily for 15 days. 15 tablet Eliezer Lofts, FNP   benzonatate (TESSALON) 200 MG capsule Take 1 capsule (200 mg total) by mouth 3 (three) times daily as needed for up to 7 days. 40 capsule Eliezer Lofts, FNP      PDMP not reviewed this encounter.   Eliezer Lofts, [place] 10/26/21 1755

## 2021-10-29 ENCOUNTER — Emergency Department: Payer: BC Managed Care – PPO

## 2021-10-29 ENCOUNTER — Emergency Department
Admission: EM | Admit: 2021-10-29 | Discharge: 2021-10-29 | Disposition: A | Discharge status: Home / Self Care | Payer: BC Managed Care – PPO | Attending: Emergency Medicine | Admitting: Emergency Medicine

## 2021-10-29 ENCOUNTER — Other Ambulatory Visit: Payer: Self-pay

## 2021-10-29 DIAGNOSIS — R0789 Other chest pain: Secondary | ICD-10-CM | POA: Diagnosis present

## 2021-10-29 DIAGNOSIS — Z20822 Contact with and (suspected) exposure to covid-19: Secondary | ICD-10-CM | POA: Diagnosis not present

## 2021-10-29 DIAGNOSIS — J189 Pneumonia, unspecified organism: Secondary | ICD-10-CM | POA: Insufficient documentation

## 2021-10-29 LAB — BASIC METABOLIC PANEL
Anion gap: 12 (ref 5–15)
BUN: 23 mg/dL — ABNORMAL HIGH (ref 6–20)
CO2: 20 mmol/L — ABNORMAL LOW (ref 22–32)
Calcium: 9.7 mg/dL (ref 8.9–10.3)
Chloride: 106 mmol/L (ref 98–111)
Creatinine, Ser: 1.06 mg/dL (ref 0.61–1.24)
GFR, Estimated: >60 mL/min (ref >60)
Glucose, Bld: 129 mg/dL — ABNORMAL HIGH (ref 70–99)
Potassium: 3.2 mmol/L — ABNORMAL LOW (ref 3.5–5.1)
Sodium: 138 mmol/L (ref 135–145)

## 2021-10-29 LAB — CBC
HCT: 46.1 % (ref 39.0–52.0)
Hemoglobin: 15.6 g/dL (ref 13.0–17.0)
MCH: 28.6 pg (ref 26.0–34.0)
MCHC: 33.8 g/dL (ref 30.0–36.0)
MCV: 84.6 fL (ref 80.0–100.0)
Platelets: 392 10*3/uL (ref 150–400)
RBC: 5.45 MIL/uL (ref 4.22–5.81)
RDW: 13.6 % (ref 11.5–15.5)
WBC: 16.5 10*3/uL — ABNORMAL HIGH (ref 4.0–10.5)
nRBC: 0 % (ref 0.0–0.2)

## 2021-10-29 LAB — TROPONIN I (HIGH SENSITIVITY)
Troponin I (High Sensitivity): 5 ng/L (ref <18)
Troponin I (High Sensitivity): 4 ng/L (ref <18)

## 2021-10-29 LAB — SARS CORONAVIRUS 2 BY RT PCR: SARS Coronavirus 2 by RT PCR: NEGATIVE

## 2021-10-29 MED ORDER — POTASSIUM CHLORIDE CRYS ER 20 MEQ PO TBCR
40.0000 meq | EXTENDED_RELEASE_TABLET | Freq: Once | ORAL | Status: AC
  Administered 2021-10-29: 40 meq via ORAL
  Filled 2021-10-29: qty 2

## 2021-10-29 MED ORDER — IOHEXOL 350 MG/ML SOLN
75.0000 mL | Freq: Once | INTRAVENOUS | Status: AC | PRN
  Administered 2021-10-29: 75 mL via INTRAVENOUS

## 2021-10-29 MED ORDER — DOXYCYCLINE HYCLATE 100 MG PO CAPS: 100.0000 mg | ORAL_CAPSULE | Freq: Two times a day (BID) | ORAL | 0 refills | Status: AC

## 2021-10-29 NOTE — ED Triage Notes (Signed)
Pt with c/o SOB x 5 days, pt states he has left arm numbness for months. Pt states he was having sharp pains in his left shoulder/chest today while has was working. Pt states he has been to urgent cares over the past few days and was given a zpack and prednisone but is not feeling better.

## 2021-10-29 NOTE — ED Notes (Signed)
See triage note  Presents with left arm pain/numbness which has been going on for a awhile.. Then he developed some SOB about a week ago Denies any fever or n/v  States he has been seen times 2 at [facility]  But this am he had some left shoulder/chest discomfort  Denies nay pain at present

## 2021-10-29 NOTE — ED Provider Notes (Signed)
[facility] Provider Note    Event Date/Time   First MD Initiated Contact with Patient 10/29/21 1334     (approximate)   History   Chief Complaint Chest Pain   HPI  Thomas [name] is a 41 y.o. male with past medical history of kidney stones and GERD who presents to the ED complaining of chest pain.  Patient reports that over the past 5 days he has been dealing with increasing difficulty breathing and pain in his chest.  He states that he constantly feels short of breath, even at rest, but symptoms seem to be exacerbated with any exertion.  He describes a very mild dry cough but denies any fevers and has not noticed any pain or swelling in his legs.  He does report intermittent sharp pain in the left side of his chest, which can come on at any time and last for only a few seconds at a time.  He states he was seen at urgent care on 2 separate occasions for this, diagnosed with bronchitis and sinus infection.  He has completed courses of antibiotics but reports no improvement in his symptoms.     Physical Exam   Triage Vital Signs: ED Triage Vitals  Enc Vitals Group     BP 10/29/21 1204 (!) 140/105     Pulse Rate 10/29/21 1204 96     Resp 10/29/21 1204 18     Temp 10/29/21 1204 97.8 F (36.6 C)     Temp Source 10/29/21 1204 Oral     SpO2 10/29/21 1204 98 %     Weight 10/29/21 1207 212 lb (96.2 kg)     Height 10/29/21 1207 5\' 9"  (1.753 m)     Head Circumference --      Peak Flow --      Pain Score 10/29/21 1206 0     Pain Loc --      Pain Edu? --      Excl. in [place]? --     Most recent vital signs: Vitals:   10/29/21 1342 10/29/21 1540  BP: (!) 134/90 135/88  Pulse: 83 83  Resp: 18 18  Temp:  (!) 97.5 F (36.4 C)  SpO2: 98% 98%    Constitutional: Alert and oriented. Eyes: Conjunctivae are normal. Head: Atraumatic. Nose: No congestion/rhinnorhea. Mouth/Throat: Mucous membranes are moist.  Cardiovascular: Normal rate, regular rhythm. Grossly  normal heart sounds.  2+ radial pulses bilaterally. Respiratory: Normal respiratory effort.  No retractions. Lungs CTAB.  No chest wall tenderness to palpation. Gastrointestinal: Soft and nontender. No distention. Musculoskeletal: No lower extremity tenderness nor edema.  Neurologic:  Normal speech and language. No gross focal neurologic deficits are appreciated.    ED Results / Procedures / Treatments   Labs (all labs ordered are listed, but only abnormal results are displayed) Labs Reviewed  BASIC METABOLIC PANEL - Abnormal; Notable for the following components:      Result Value   Potassium 3.2 (*)    CO2 20 (*)    Glucose, Bld 129 (*)    BUN 23 (*)    All other components within normal limits  CBC - Abnormal; Notable for the following components:   WBC 16.5 (*)    All other components within normal limits  SARS CORONAVIRUS 2 BY RT PCR  TROPONIN I (HIGH SENSITIVITY)  TROPONIN I (HIGH SENSITIVITY)     EKG  ED ECG REPORT I, Blake Divine, the attending physician, personally viewed and interpreted this ECG.  Date: 10/29/2021  EKG Time: 12:09  Rate: 87  Rhythm: normal sinus rhythm  Axis: Normal  Intervals:none  ST&T Change: None  RADIOLOGY Chest x-ray reviewed and interpreted by me with no infiltrate, edema, or effusion.  PROCEDURES:  Critical Care performed: No  Procedures   MEDICATIONS ORDERED IN ED: Medications  iohexol (OMNIPAQUE) 350 MG/ML injection 75 mL (75 mLs Intravenous Contrast Given 10/29/21 1515)  potassium chloride SA (KLOR-CON M) CR tablet 40 mEq (40 mEq Oral Given 10/29/21 1614)     IMPRESSION / MDM / ASSESSMENT AND PLAN / ED COURSE  I reviewed the triage vital signs and the nursing notes.                              41 y.o. male with past medical history of kidney stones and GERD who presents to the ED with worsening difficulty breathing over the past 5 days, especially with exertion and associated with some intermittent sharp pain in  his chest.  Patient's presentation is most consistent with acute presentation with potential threat to life or bodily function.  Differential diagnosis includes, but is not limited to, ACS, PE, dissection, pneumonia, pneumothorax, GERD, musculoskeletal pain, and anxiety.  Patient nontoxic-appearing and in no acute distress, vital signs are unremarkable.  EKG shows no evidence of arrhythmia or ischemia and initial troponin is negative, doubt ACS given his atypical symptoms.  Remainder of labs are reassuring with no significant anemia, leukocytosis, electrolyte abnormality, or AKI.  Chest x-ray is also unremarkable.  Given worsening difficulty breathing with sharp pain in his chest, we will check CTA of chest to rule out PE.  We will also repeat troponin given his intermittent pain.  Repeat troponin within normal limits, CTA of chest is negative for PE but does show groundglass opacity concerning for infectious process.  Patient previously treated with azithromycin and given his penicillin allergy, we will treat with doxycycline.  We will also send testing for COVID-19, however he was tested earlier this week and was negative at that time.  He continues to breathe comfortably on room air and is appropriate for outpatient management, was counseled to follow-up with his PCP and to return to the ED for new or worsening symptoms.  Patient agrees with plan.      FINAL CLINICAL IMPRESSION(S) / ED DIAGNOSES   Final diagnoses:  Atypical chest pain  Community acquired pneumonia, unspecified laterality     Rx / DC Orders   ED Discharge Orders          Ordered    doxycycline (VIBRAMYCIN) 100 MG capsule  2 times daily        10/29/21 1647             Note:  This document was prepared using Dragon voice recognition software and may include unintentional dictation errors.   Chesley Noon, MD 10/29/21 1650

## 2022-06-08 ENCOUNTER — Other Ambulatory Visit (HOSPITAL_BASED_OUTPATIENT_CLINIC_OR_DEPARTMENT_OTHER): Payer: Self-pay

## 2022-06-08 DIAGNOSIS — G4733 Obstructive sleep apnea (adult) (pediatric): Secondary | ICD-10-CM

## 2022-09-16 ENCOUNTER — Encounter (HOSPITAL_COMMUNITY): Payer: Self-pay

## 2022-09-16 ENCOUNTER — Emergency Department (HOSPITAL_COMMUNITY)
Admission: EM | Admit: 2022-09-16 | Discharge: 2022-09-16 | Disposition: A | Discharge status: Home / Self Care | Payer: BC Managed Care – PPO | Attending: Emergency Medicine | Admitting: Emergency Medicine

## 2022-09-16 ENCOUNTER — Emergency Department (HOSPITAL_COMMUNITY): Payer: BC Managed Care – PPO

## 2022-09-16 DIAGNOSIS — R0602 Shortness of breath: Secondary | ICD-10-CM | POA: Insufficient documentation

## 2022-09-16 DIAGNOSIS — Z1152 Encounter for screening for COVID-19: Secondary | ICD-10-CM | POA: Diagnosis not present

## 2022-09-16 DIAGNOSIS — R079 Chest pain, unspecified: Secondary | ICD-10-CM | POA: Diagnosis not present

## 2022-09-16 HISTORY — DX: Essential (primary) hypertension: I10

## 2022-09-16 LAB — HEPATIC FUNCTION PANEL
ALT: 28 U/L (ref 0–44)
AST: 21 U/L (ref 15–41)
Albumin: 4.5 g/dL (ref 3.5–5.0)
Alkaline Phosphatase: 93 U/L (ref 38–126)
Bilirubin, Direct: 0.1 mg/dL (ref 0.0–0.2)
Indirect Bilirubin: 0.6 mg/dL (ref 0.3–0.9)
Total Bilirubin: 0.7 mg/dL (ref 0.3–1.2)
Total Protein: 7.8 g/dL (ref 6.5–8.1)

## 2022-09-16 LAB — D-DIMER, QUANTITATIVE: D-Dimer, Quant: <0.27 ug/mL-FEU (ref 0.00–0.50)

## 2022-09-16 LAB — CBC
HCT: 47.1 % (ref 39.0–52.0)
Hemoglobin: 15.8 g/dL (ref 13.0–17.0)
MCH: 28.7 pg (ref 26.0–34.0)
MCHC: 33.5 g/dL (ref 30.0–36.0)
MCV: 85.5 fL (ref 80.0–100.0)
Platelets: 316 10*3/uL (ref 150–400)
RBC: 5.51 MIL/uL (ref 4.22–5.81)
RDW: 13.4 % (ref 11.5–15.5)
WBC: 8.8 10*3/uL (ref 4.0–10.5)
nRBC: 0 % (ref 0.0–0.2)

## 2022-09-16 LAB — BASIC METABOLIC PANEL
Anion gap: 11 (ref 5–15)
BUN: 17 mg/dL (ref 6–20)
CO2: 23 mmol/L (ref 22–32)
Calcium: 9.4 mg/dL (ref 8.9–10.3)
Chloride: 104 mmol/L (ref 98–111)
Creatinine, Ser: 1.08 mg/dL (ref 0.61–1.24)
GFR, Estimated: >60 mL/min (ref >60)
Glucose, Bld: 104 mg/dL — ABNORMAL HIGH (ref 70–99)
Potassium: 4 mmol/L (ref 3.5–5.1)
Sodium: 138 mmol/L (ref 135–145)

## 2022-09-16 LAB — TROPONIN I (HIGH SENSITIVITY)
Troponin I (High Sensitivity): <2 ng/L (ref <18)
Troponin I (High Sensitivity): <2 ng/L (ref <18)

## 2022-09-16 LAB — SARS CORONAVIRUS 2 BY RT PCR: SARS Coronavirus 2 by RT PCR: NEGATIVE

## 2022-09-16 MED ORDER — DOXYCYCLINE HYCLATE 100 MG PO CAPS: 100.0000 mg | ORAL_CAPSULE | Freq: Two times a day (BID) | ORAL | 0 refills | Status: AC

## 2022-09-16 NOTE — Discharge Instructions (Signed)
We are treating you for atypical pneumonia with a course of antibiotics.  You should take the antibiotics with food and take the entire course.  You should avoid sun exposure as it may make you more sensitive to the sun.  You should call your doctor today to schedule close follow-up.  If you develop chest pain, difficulty breathing or any other new concerning symptoms you should return to the ED.

## 2022-09-16 NOTE — ED Provider Triage Note (Addendum)
Emergency Medicine Provider Triage Evaluation Note  Thomas [name] , a 42 y.o. male  was evaluated in triage.  Pt complains of feels the need to take a deep breath for the past 2 weeks. Same last year, seen at [facility], told PNA- treated with abx and symptoms resolved. Went to clinic at work at 10:30am today, told lungs CTA, told symptoms possibly heart related. Reports upper chest pressure, muscles feel tense, intermittent for the past 2 weeks. Discomfort behind left upper arm, feels busied. Breathing change seems constant. No lower ext edema. No weight gain, no hx CHF, no hx asthma/chronic lung disease.  Quit smoking in 2020?, quit vaping 3 weeks ago.  No recent travel, no hormone therapy, no hx of CA/PE/DVT.    Review of Systems  Positive: Mild cough/congestion/drainage  Negative: Fevers   Physical Exam  BP (!) 147/111 (BP Location: Right Arm)   Pulse 89   Temp 98.3 F (36.8 C) (Oral)   Resp 16   SpO2 100%  Gen:   Awake, no distress   Resp:  Normal effort  MSK:   Moves extremities without difficulty  Other:    Medical Decision Making  Medically screening exam initiated at 12:22 PM.  Appropriate orders placed.  Thomas [name] was informed that the remainder of the evaluation will be completed by another provider, this initial triage assessment does not replace that evaluation, and the importance of remaining in the ED until their evaluation is complete.     Jeannie Fend, PA-C 09/16/22 1225    Jeannie Fend, PA-C 09/16/22 1226

## 2022-09-16 NOTE — ED Provider Notes (Signed)
[place] EMERGENCY DEPARTMENT AT [facility] Provider Note   CSN: [id number] Arrival date & time: 09/16/22  1145     History  Chief Complaint  Patient presents with   Chest Pain   Shortness of Breath    Thomas [name] is a 42 y.o. male.   Chest Pain Associated symptoms: shortness of breath   Shortness of Breath Associated symptoms: chest pain   42 year old male history of hypertension presenting for shortness of breath.  Patient states for about the last 2 weeks he has had gradually worsening shortness of breath.  He states he feels like he needs to take a deep breath.  He notes some mild chest tightness but no frank pain.  He had some pain to the back of his left arm earlier today although this was reproducible.  No trauma.  No fevers or chills but has had some generalized fatigue.  He states an episode about a year ago which was attributed to pneumonia and treated with doxycycline improved with antibiotics.  He states this feels exactly like that episode.  Is not had any travel or sick contacts.  No chemical exposure as far she is aware.  He is a Curator.  No abdominal pain.  No history of ACS or stroke.  He does not smoke.  He has no back pain or radiation of pain to his back.  No headache or neurologic symptoms.     Home Medications Prior to Admission medications   Medication Sig Start Date End Date Taking? Authorizing Provider  atorvastatin (LIPITOR) 40 MG tablet Take 40 mg by mouth at bedtime. 04/14/22  Yes [provider]  doxycycline (VIBRAMYCIN) 100 MG capsule Take 1 capsule (100 mg total) by mouth 2 (two) times daily. 09/16/22  Yes Laurence Spates, MD  esomeprazole (NEXIUM) 20 MG capsule Take 20 mg by mouth daily.   Yes [provider]  ibuprofen (ADVIL) 200 MG tablet Take 800 mg by mouth daily as needed for moderate pain.   Yes [provider]  metFORMIN (GLUCOPHAGE-XR) 500 MG 24 hr tablet Take 500 mg by mouth 2 (two) times daily.  04/14/22  Yes [provider]      Allergies    Penicillins    Review of Systems   Review of Systems  Respiratory:  Positive for shortness of breath.   Cardiovascular:  Positive for chest pain.  Review of systems completed and notable as per HPI.  ROS otherwise negative.   Physical Exam Updated Vital Signs BP (!) 147/111 (BP Location: Right Arm)   Pulse 89   Temp 98.3 F (36.8 C) (Oral)   Resp 16   SpO2 100%  Physical Exam Vitals and nursing note reviewed.  Constitutional:      General: He is not in acute distress.    Appearance: He is well-developed.  HENT:     Head: Normocephalic and atraumatic.  Eyes:     Conjunctiva/sclera: Conjunctivae normal.     Pupils: Pupils are equal, round, and reactive to light.  Cardiovascular:     Rate and Rhythm: Normal rate and regular rhythm.     Pulses:          Radial pulses are 2+ on the right side and 2+ on the left side.       Dorsalis pedis pulses are 2+ on the right side and 2+ on the left side.     Heart sounds: Normal heart sounds. No murmur heard. Pulmonary:     Effort:  Pulmonary effort is normal. No respiratory distress.     Breath sounds: Normal breath sounds.  Chest:     Chest wall: No tenderness.  Abdominal:     Palpations: Abdomen is soft.     Tenderness: There is no abdominal tenderness.  Musculoskeletal:        General: No swelling or tenderness.     Cervical back: Neck supple.     Right lower leg: No tenderness. No edema.     Left lower leg: No tenderness. No edema.  Skin:    General: Skin is warm and dry.     Capillary Refill: Capillary refill takes less than 2 seconds.  Neurological:     General: No focal deficit present.     Mental Status: He is alert and oriented to person, place, and time.     Cranial Nerves: No cranial nerve deficit.     Motor: No weakness.  Psychiatric:        Mood and Affect: Mood normal.     ED Results / Procedures / Treatments   Labs (all labs ordered are listed, but  only abnormal results are displayed) Labs Reviewed  BASIC METABOLIC PANEL - Abnormal; Notable for the following components:      Result Value   Glucose, Bld 104 (*)    All other components within normal limits  SARS CORONAVIRUS 2 BY RT PCR  CBC  HEPATIC FUNCTION PANEL  D-DIMER, QUANTITATIVE  TROPONIN I (HIGH SENSITIVITY)  TROPONIN I (HIGH SENSITIVITY)    EKG EKG Interpretation Date/Time:  Thursday September 16 2022 12:00:55 EDT Ventricular Rate:  81 PR Interval:  164 QRS Duration:  101 QT Interval:  391 QTC Calculation: 454 R Axis:   61  Text Interpretation: Sinus rhythm Baseline wander in lead(s) I aVR Confirmed by Fulton Reek [id number]) on 09/16/2022 12:02:47 PM  Radiology DG Chest 2 View  Result Date: 09/16/2022 CLINICAL DATA:  chest pain EXAM: CHEST - 2 VIEW COMPARISON:  CXR 10/29/21 FINDINGS: No pleural effusion. No pneumothorax. No focal airspace opacity. No radiographically apparent displaced rib fractures. Visualized upper abdomen unremarkable. Normal cardiac and mediastinal contours. Vertebral body heights are maintained. IMPRESSION: No focal airspace opacity Electronically Signed   By: Lorenza Cambridge M.D.   On: 09/16/2022 12:38    Procedures Procedures    Medications Ordered in ED Medications - No data to display  ED Course/ Medical Decision Making/ A&P Clinical Course as of 09/16/22 1546  Thu Sep 16, 2022  1434 HEAR 1 [JD]    Clinical Course User Index [JD] Laurence Spates, MD             HEART Score: 1                    Medical Decision Making Amount and/or Complexity of Data Reviewed Labs: ordered. Radiology: ordered.   Medical Decision Making:   Thomas [name] is a 42 y.o. male who presented to the ED today with cough, fatigue, shortness of breath.  Vitals notable for mild hypertension.  He is well-appearing on exam, normal work of breathing with clear lungs.  He has had recent cough and fatigue, differential includes pneumonia, atypical pneumonia,  PE, viral infection.  EKG without ischemia, hear score of 1.  Will obtain troponin although low concern for ACS.  History and physical seems inconsistent with dissection.   Patient placed on continuous vitals and telemetry monitoring while in ED which was reviewed periodically.  Reviewed and confirmed nursing documentation for  past medical history, family history, social history.  Initial Study Results:   Laboratory  All laboratory results reviewed.  Labs notable for troponin negative x 2.  D-dimer negative.  CBC, CMP unremarkable.  EKG EKG was reviewed independently. Rate, rhythm, axis, intervals all examined and without medically relevant abnormality. ST segments without concerns for elevations.    Radiology:  All images reviewed independently.  Agree with radiology report at this time.     Reassessment and Plan:   Patient remained stable.  Normal work of breathing, no hypoxia.  D-dimer is negative, reassuring against PE.  His EKG is reassuring, symptoms are atypical, and 2 negative troponins are reassuring against ACS at this time.  I reviewed his chest x-ray, no focal pneumonia, pulmonary edema, normal cardiac silhouette.  He does not have any wheezing, low suspicion for undiagnosed COPD.  He did have a very similar presentation in September 2023.  He states his symptoms are the same, at that time he had a CT scan which showed groundglass opacity concerning for atypical infection improved with doxycycline.  Given similar to prior episode with cough, malaise without other clear cause I think is reasonable to treat for atypical pneumonia with course of doxycycline.  I discussed this with the patient, he is comfortable this.  I recommend he call his doctor today to schedule close follow-up.  Given strict return precautions for frank chest pain, worsening shortness of breath or any other new concerning symptoms.  He was comfortable this plan.  Discharged in stable condition.   Patient's  presentation is most consistent with acute presentation with potential threat to life or bodily function.           Final Clinical Impression(s) / ED Diagnoses Final diagnoses:  Shortness of breath    Rx / DC Orders ED Discharge Orders          Ordered    doxycycline (VIBRAMYCIN) 100 MG capsule  2 times daily        09/16/22 1546              Laurence Spates, MD 09/16/22 1546

## 2022-09-16 NOTE — ED Triage Notes (Signed)
Pt presents with c/o chest pain and shortness of breath. Pt reports the shortness of breath has been going on for approx one week and the chest pain started yesterday with some pain in the back of his left arm as well. Pt in NAD.

## 2022-09-20 ENCOUNTER — Other Ambulatory Visit (HOSPITAL_COMMUNITY): Payer: Self-pay | Admitting: Internal Medicine

## 2022-09-20 DIAGNOSIS — R0600 Dyspnea, unspecified: Secondary | ICD-10-CM

## 2022-09-21 ENCOUNTER — Ambulatory Visit (HOSPITAL_COMMUNITY): Payer: BC Managed Care – PPO

## 2022-09-21 DIAGNOSIS — R069 Unspecified abnormalities of breathing: Secondary | ICD-10-CM | POA: Diagnosis present

## 2022-09-21 DIAGNOSIS — I10 Essential (primary) hypertension: Secondary | ICD-10-CM | POA: Diagnosis not present

## 2022-09-21 DIAGNOSIS — R0609 Other forms of dyspnea: Secondary | ICD-10-CM | POA: Diagnosis not present

## 2022-09-21 DIAGNOSIS — R0600 Dyspnea, unspecified: Secondary | ICD-10-CM | POA: Diagnosis present

## 2022-09-21 LAB — ECHOCARDIOGRAM COMPLETE
Area-P 1/2: 3.5 cm2
S' Lateral: 3.8 cm

## 2022-09-22 ENCOUNTER — Other Ambulatory Visit: Payer: Self-pay | Admitting: Radiology

## 2022-09-22 DIAGNOSIS — R0600 Dyspnea, unspecified: Secondary | ICD-10-CM

## 2022-09-24 ENCOUNTER — Other Ambulatory Visit (HOSPITAL_BASED_OUTPATIENT_CLINIC_OR_DEPARTMENT_OTHER): Payer: Self-pay

## 2022-09-24 DIAGNOSIS — G4733 Obstructive sleep apnea (adult) (pediatric): Secondary | ICD-10-CM

## 2022-09-28 ENCOUNTER — Ambulatory Visit: Payer: BC Managed Care – PPO | Attending: Internal Medicine

## 2022-09-28 DIAGNOSIS — R0600 Dyspnea, unspecified: Secondary | ICD-10-CM

## 2022-09-28 LAB — EXERCISE TOLERANCE TEST
Angina Index: 0
Duke Treadmill Score: 10
Estimated workload: 12.1
Exercise duration (min): 10 min
Exercise duration (sec): 13 s
MPHR: 177 {beats}/min
Peak HR: 166 {beats}/min
Percent HR: 93 %
RPE: 15
Rest HR: 88 {beats}/min
ST Depression (mm): 0 mm

## 2022-10-18 ENCOUNTER — Ambulatory Visit: Payer: Self-pay | Admitting: Cardiology

## 2022-10-26 ENCOUNTER — Encounter (HOSPITAL_BASED_OUTPATIENT_CLINIC_OR_DEPARTMENT_OTHER): Payer: BC Managed Care – PPO | Admitting: Internal Medicine

## 2024-02-22 ENCOUNTER — Encounter (HOSPITAL_COMMUNITY): Payer: Self-pay

## 2024-02-22 ENCOUNTER — Other Ambulatory Visit: Payer: Self-pay

## 2024-02-22 ENCOUNTER — Emergency Department (HOSPITAL_COMMUNITY)
Admission: EM | Admit: 2024-02-22 | Discharge: 2024-02-22 | Disposition: A | Discharge status: Home / Self Care | Attending: Emergency Medicine | Admitting: Emergency Medicine

## 2024-02-22 ENCOUNTER — Emergency Department (HOSPITAL_COMMUNITY)

## 2024-02-22 DIAGNOSIS — Z21 Asymptomatic human immunodeficiency virus [HIV] infection status: Secondary | ICD-10-CM | POA: Insufficient documentation

## 2024-02-22 DIAGNOSIS — M545 Low back pain, unspecified: Secondary | ICD-10-CM | POA: Insufficient documentation

## 2024-02-22 DIAGNOSIS — D72829 Elevated white blood cell count, unspecified: Secondary | ICD-10-CM | POA: Insufficient documentation

## 2024-02-22 DIAGNOSIS — Z859 Personal history of malignant neoplasm, unspecified: Secondary | ICD-10-CM | POA: Diagnosis not present

## 2024-02-22 LAB — COMPREHENSIVE METABOLIC PANEL WITH GFR
ALT: 42 U/L (ref 0–44)
AST: 27 U/L (ref 15–41)
Albumin: 4.8 g/dL (ref 3.5–5.0)
Alkaline Phosphatase: 118 U/L (ref 38–126)
Anion gap: 15 (ref 5–15)
BUN: 18 mg/dL (ref 6–20)
CO2: 22 mmol/L (ref 22–32)
Calcium: 9.6 mg/dL (ref 8.9–10.3)
Chloride: 104 mmol/L (ref 98–111)
Creatinine, Ser: 0.92 mg/dL (ref 0.61–1.24)
GFR, Estimated: >60 mL/min
Glucose, Bld: 89 mg/dL (ref 70–99)
Potassium: 3.9 mmol/L (ref 3.5–5.1)
Sodium: 141 mmol/L (ref 135–145)
Total Bilirubin: 0.4 mg/dL (ref 0.0–1.2)
Total Protein: 7.6 g/dL (ref 6.5–8.1)

## 2024-02-22 LAB — CBC WITH DIFFERENTIAL/PLATELET
Abs Immature Granulocytes: 0.03 K/uL (ref 0.00–0.07)
Basophils Absolute: 0.1 K/uL (ref 0.0–0.1)
Basophils Relative: 1 %
Eosinophils Absolute: 0.2 K/uL (ref 0.0–0.5)
Eosinophils Relative: 2 %
HCT: 45.6 % (ref 39.0–52.0)
Hemoglobin: 15.4 g/dL (ref 13.0–17.0)
Immature Granulocytes: 0 %
Lymphocytes Relative: 22 %
Lymphs Abs: 2.3 K/uL (ref 0.7–4.0)
MCH: 28.9 pg (ref 26.0–34.0)
MCHC: 33.8 g/dL (ref 30.0–36.0)
MCV: 85.7 fL (ref 80.0–100.0)
Monocytes Absolute: 0.9 K/uL (ref 0.1–1.0)
Monocytes Relative: 8 %
Neutro Abs: 7.2 K/uL (ref 1.7–7.7)
Neutrophils Relative %: 67 %
Platelets: 329 K/uL (ref 150–400)
RBC: 5.32 MIL/uL (ref 4.22–5.81)
RDW: 13.2 % (ref 11.5–15.5)
WBC: 10.6 K/uL — ABNORMAL HIGH (ref 4.0–10.5)
nRBC: 0 % (ref 0.0–0.2)

## 2024-02-22 MED ORDER — NAPROXEN 500 MG PO TABS: 500.0000 mg | ORAL_TABLET | Freq: Two times a day (BID) | ORAL | 0 refills | Status: AC

## 2024-02-22 MED ORDER — METHOCARBAMOL 750 MG PO TABS: 750.0000 mg | ORAL_TABLET | Freq: Three times a day (TID) | ORAL | 0 refills | Status: AC

## 2024-02-22 NOTE — ED Triage Notes (Signed)
 Pt arrived via POV c/o lower back pain that began Monday. 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 Pt describes pain as a tightness and reports trying heat therapy for relief w/o success.

## 2024-02-22 NOTE — Discharge Instructions (Signed)
 Please follow-up closely with your primary care doctor on an outpatient basis.  Resources for back doctor were provided.  Please return to the emergency department immediately for any new or worsening symptoms.

## 2024-02-22 NOTE — ED Provider Notes (Signed)
 " [place] EMERGENCY DEPARTMENT AT [facility] Provider Note   CSN: [id number] Arrival date & time: 02/22/24  1427     Patient presents with: Back Pain   Thomas [name] is a 44 y.o. male.   Patient is a 44 year old male who presents emergency department the chief complaint of low back pain which has been ongoing for approximate the past 3 days.  Patient notes that he awoke 3 days ago with some mild pain in his lower back and got worse after bending over at work.  He describes it as a tightness sensation.  He has had no recent falls or blunt trauma.  He has had no urinary bowel incontinence, saddle paresthesias, gait changes, fever, chills, history of IV drug use, history of HIV, history of cancer, history of chronic steroid use.  He denies any abdominal pain, nausea, vomiting, diarrhea.  He has had no dysuria or hematuria.   Back Pain      Prior to Admission medications  Medication Sig Start Date End Date Taking? Authorizing Provider  atorvastatin (LIPITOR) 40 MG tablet Take 40 mg by mouth at bedtime. 04/14/22   [provider]  doxycycline  (VIBRAMYCIN ) 100 MG capsule Take 1 capsule (100 mg total) by mouth 2 (two) times daily. 09/16/22   Davis, Jonathon H, MD  esomeprazole (NEXIUM) 20 MG capsule Take 20 mg by mouth daily.    [provider]  ibuprofen  (ADVIL ) 200 MG tablet Take 800 mg by mouth daily as needed for moderate pain.    [provider]  metFORMIN (GLUCOPHAGE-XR) 500 MG 24 hr tablet Take 500 mg by mouth 2 (two) times daily. 04/14/22   [provider]    Allergies: Penicillins    Review of Systems  Musculoskeletal:  Positive for back pain.  All other systems reviewed and are negative.   Updated Vital Signs BP (!) 149/97 (BP Location: Right Arm)   Pulse 95   Temp 97.9 F (36.6 C) (Oral)   Resp 20   Ht 5' 9 (1.753 m)   Wt 96.2 kg   SpO2 98%   BMI 31.32 kg/m   Physical Exam Vitals and nursing note reviewed.   Constitutional:      General: He is not in acute distress.    Appearance: Normal appearance. He is not ill-appearing.  HENT:     Head: Normocephalic and atraumatic.     Nose: Nose normal.     Mouth/Throat:     Mouth: Mucous membranes are moist.  Eyes:     Extraocular Movements: Extraocular movements intact.     Conjunctiva/sclera: Conjunctivae normal.     Pupils: Pupils are equal, round, and reactive to light.  Cardiovascular:     Rate and Rhythm: Normal rate and regular rhythm.     Pulses: Normal pulses.     Heart sounds: Normal heart sounds. No murmur heard.    No gallop.  Pulmonary:     Effort: Pulmonary effort is normal. No respiratory distress.     Breath sounds: Normal breath sounds. No stridor. No wheezing, rhonchi or rales.  Abdominal:     General: Abdomen is flat. Bowel sounds are normal. There is no distension.     Palpations: Abdomen is soft.     Tenderness: There is no abdominal tenderness. There is no guarding.  Musculoskeletal:        General: Normal range of motion.     Cervical back: Normal range of motion and neck supple.     Comments:  Tender to palpation over the right lateral aspect of the back and paraspinous muscles, no midline tenderness, no step-off or deformity, no direct CVA tenderness  Skin:    General: Skin is warm and dry.  Neurological:     General: No focal deficit present.     Mental Status: He is alert and oriented to person, place, and time. Mental status is at baseline.     Cranial Nerves: No cranial nerve deficit.     Sensory: No sensory deficit.     Motor: No weakness.     Coordination: Coordination normal.     Gait: Gait normal.     Deep Tendon Reflexes: Reflexes normal.     Comments: Extension bilateral great toes intact, extension and flexion at hips intact  Psychiatric:        Mood and Affect: Mood normal.        Behavior: Behavior normal.        Thought Content: Thought content normal.        Judgment: Judgment normal.      (all labs ordered are listed, but only abnormal results are displayed) Labs Reviewed  COMPREHENSIVE METABOLIC PANEL WITH GFR  CBC WITH DIFFERENTIAL/PLATELET  URINALYSIS, ROUTINE W REFLEX MICROSCOPIC    EKG: None  Radiology: No results found.   Procedures   Medications Ordered in the ED - No data to display                                  Medical Decision Making Amount and/or Complexity of Data Reviewed Labs: ordered. Radiology: ordered.  Risk Prescription drug management.   This patient presents to the ED for concern of back pain differential diagnosis includes musculoskeletal pain, degenerative disc disease, pyelonephritis, kidney stone, cauda equina syndrome, vertebral osteomyelitis, epidural abscess    Additional history obtained:  Additional history obtained from none External records from outside source obtained and reviewed including none   Lab Tests:  I Ordered, and personally interpreted labs.  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  Discussed with patient that CT scan findings are consistent with degenerative disc disease in the lower lumbar spine.  He has no concerning neurological deficits at this time.  He is TUNAFISH negative.  Do not suspect that emergent MRI is warranted at this time.  CT scan of the abdomen and pelvis was unremarked for any signs of acute intra-abdominal surgical process.  Blood work is unremarkable.  Patient has had no urinary symptoms and do  not suspect a urinalysis is warranted at this time.  He has no indication for ureteral stone.  There was no direct CVA tenderness with low suspicion for pyelonephritis.  Will continue symptomatic treatment outpatient basis and recommend close follow-up with his primary care doctor on an outpatient basis.  Resources for follow-up with a back provider was discussed as well and provided.  Strict return precautions were provided for any new or worsening symptoms.  Patient voiced understanding and had no additional questions.   Social  Determinants of Health:  None        Final diagnoses:  None    ED Discharge Orders     None          Daralene Lonni JONETTA DEVONNA 02/22/24 1811    Francesca Elsie CROME, MD 02/22/24 1931  "
